# Patient Record
Sex: Male | Born: 1960 | Race: Black or African American | Hispanic: No | Marital: Single | Smoking: Never smoker
Health system: Southern US, Community
[De-identification: ages and names within clinical notes are randomized; demographics above are authoritative.]

## PROBLEM LIST (undated history)

## (undated) DIAGNOSIS — E7800 Pure hypercholesterolemia, unspecified: Secondary | ICD-10-CM

## (undated) DIAGNOSIS — I10 Essential (primary) hypertension: Secondary | ICD-10-CM

## (undated) DIAGNOSIS — L730 Acne keloid: Secondary | ICD-10-CM

## (undated) HISTORY — PX: HERNIA REPAIR: SHX51

## (undated) HISTORY — DX: Acne keloid: L73.0

## (undated) HISTORY — PX: CYST REMOVAL NECK: SHX6281

## (undated) HISTORY — DX: Pure hypercholesterolemia, unspecified: E78.00

---

## 1998-09-24 ENCOUNTER — Emergency Department (HOSPITAL_COMMUNITY): Admission: EM | Admit: 1998-09-24 | Discharge: 1998-09-24 | Payer: Self-pay | Admitting: *Deleted

## 1998-09-24 ENCOUNTER — Encounter: Payer: Self-pay | Admitting: *Deleted

## 1998-12-24 ENCOUNTER — Emergency Department (HOSPITAL_COMMUNITY): Admission: EM | Admit: 1998-12-24 | Discharge: 1998-12-24 | Payer: Self-pay | Admitting: Emergency Medicine

## 1998-12-24 ENCOUNTER — Encounter: Payer: Self-pay | Admitting: Emergency Medicine

## 1999-01-29 ENCOUNTER — Ambulatory Visit (HOSPITAL_COMMUNITY): Admission: RE | Admit: 1999-01-29 | Discharge: 1999-01-29 | Payer: Self-pay | Admitting: Urology

## 2009-03-31 ENCOUNTER — Encounter: Admission: RE | Admit: 2009-03-31 | Discharge: 2009-04-23 | Payer: Self-pay | Admitting: Family Medicine

## 2010-08-27 ENCOUNTER — Encounter (INDEPENDENT_AMBULATORY_CARE_PROVIDER_SITE_OTHER): Payer: Self-pay | Admitting: General Surgery

## 2010-08-31 ENCOUNTER — Encounter (INDEPENDENT_AMBULATORY_CARE_PROVIDER_SITE_OTHER): Payer: Self-pay | Admitting: General Surgery

## 2010-08-31 ENCOUNTER — Ambulatory Visit (INDEPENDENT_AMBULATORY_CARE_PROVIDER_SITE_OTHER): Payer: BC Managed Care – PPO | Admitting: General Surgery

## 2010-08-31 VITALS — BP 150/90 | HR 72 | Temp 98.4°F | Ht 67.0 in | Wt 256.8 lb

## 2010-08-31 DIAGNOSIS — R229 Localized swelling, mass and lump, unspecified: Secondary | ICD-10-CM

## 2010-08-31 DIAGNOSIS — R220 Localized swelling, mass and lump, head: Secondary | ICD-10-CM | POA: Insufficient documentation

## 2010-08-31 NOTE — Patient Instructions (Signed)
Plan to excise scalp mass

## 2010-08-31 NOTE — Progress Notes (Signed)
Subjective:     Patient ID: Brian Kennedy, male   DOB: 1960/02/19, 50 y.o.   MRN: [id number]  HPI We're asked to see the patient in consultation by Dr. Campbell Stall to evaluate him for a mass on the scalp. The patient is a 50 year old white male who has had a lump on the back of his head for a number of years. Over time it has slowly enlarged. He denies any headaches. He denies any drainage from the area. He denies any fevers or chills. No chest pain or shortness of breath. Review of Systems  Constitutional: Negative.   HENT: Negative.   Eyes: Negative.   Respiratory: Negative.   Cardiovascular: Negative.   Gastrointestinal: Negative.   Genitourinary: Negative.   Musculoskeletal: Negative.   Skin: Negative.   Neurological: Negative.   Hematological: Negative.   Psychiatric/Behavioral: Negative.    Past Medical History  Diagnosis Date  . Diabetes mellitus   . Hypercholesterolemia   . Acne keloidalis nuchae    No past surgical history on file.  Current outpatient prescriptions:clindamycin (CLEOCIN T) 1 % external solution, Apply topically daily.  , Disp: , Rfl: ;  Clobetasol Propionate (CLOBETASOL 17 PROPIONATE) 0.5 % POWD, by Does not apply route daily.  , Disp: , Rfl: ;  doxycycline (VIBRAMYCIN) 100 MG capsule, Take 100 mg by mouth 2 (two) times daily.  , Disp: , Rfl:   Not on File      Objective:   Physical Exam  Constitutional: He is oriented to person, place, and time. He appears well-developed and well-nourished.  HENT:  Head: Normocephalic and atraumatic.       Approximately 2 cm mass in the occipital area. It is mobile. No sign of infection.  Eyes: Conjunctivae and EOM are normal. Pupils are equal, round, and reactive to light.  Neck: Normal range of motion. Neck supple.  Cardiovascular: Normal rate, regular rhythm and normal heart sounds.   Pulmonary/Chest: Effort normal and breath sounds normal.  Abdominal: Soft. Bowel sounds are normal.  Musculoskeletal: Normal  range of motion.  Neurological: He is alert and oriented to person, place, and time.  Skin: Skin is warm and dry.  Psychiatric: He has a normal mood and affect. His behavior is normal.       Assessment:     Mass on the occipital area of the scalp.    Plan:     The patient elected to have this removed and since it has been enlarging I think this is a very reasonable thing to do. I discussed with him in detail the risks and benefits they approached and remove this mass as well some technical aspects and understands and wishes to proceed.

## 2010-09-14 DIAGNOSIS — L738 Other specified follicular disorders: Secondary | ICD-10-CM

## 2010-09-16 ENCOUNTER — Encounter (HOSPITAL_COMMUNITY)
Admission: RE | Admit: 2010-09-16 | Discharge: 2010-09-16 | Disposition: A | Discharge status: Home / Self Care | Payer: BC Managed Care – PPO | Source: Ambulatory Visit | Attending: General Surgery | Admitting: General Surgery

## 2010-09-16 LAB — DIFFERENTIAL
Basophils Absolute: 0 10*3/uL (ref 0.0–0.1)
Basophils Relative: 0 % (ref 0–1)
Eosinophils Absolute: 0.1 10*3/uL (ref 0.0–0.7)
Eosinophils Relative: 1 % (ref 0–5)
Lymphocytes Relative: 35 % (ref 12–46)
Lymphs Abs: 2 10*3/uL (ref 0.7–4.0)
Monocytes Absolute: 0.4 10*3/uL (ref 0.1–1.0)
Monocytes Relative: 7 % (ref 3–12)
Neutro Abs: 3.3 10*3/uL (ref 1.7–7.7)
Neutrophils Relative %: 57 % (ref 43–77)

## 2010-09-16 LAB — CBC
HCT: 44.2 % (ref 39.0–52.0)
Hemoglobin: 14.7 g/dL (ref 13.0–17.0)
MCH: 29.2 pg (ref 26.0–34.0)
MCHC: 33.3 g/dL (ref 30.0–36.0)
MCV: 87.7 fL (ref 78.0–100.0)
Platelets: 223 K/uL (ref 150–400)
RBC: 5.04 MIL/uL (ref 4.22–5.81)
RDW: 14 % (ref 11.5–15.5)
WBC: 5.8 K/uL (ref 4.0–10.5)

## 2010-09-16 LAB — BASIC METABOLIC PANEL
BUN: 13 mg/dL (ref 6–23)
Calcium: 10.7 mg/dL — ABNORMAL HIGH (ref 8.4–10.5)
Creatinine, Ser: 1.06 mg/dL (ref 0.50–1.35)
GFR calc Af Amer: >60 mL/min (ref >60)

## 2010-09-16 LAB — SURGICAL PCR SCREEN
MRSA, PCR: NEGATIVE
Staphylococcus aureus: NEGATIVE

## 2010-09-24 ENCOUNTER — Other Ambulatory Visit (INDEPENDENT_AMBULATORY_CARE_PROVIDER_SITE_OTHER): Payer: Self-pay | Admitting: General Surgery

## 2010-09-24 ENCOUNTER — Ambulatory Visit (HOSPITAL_COMMUNITY)
Admission: RE | Admit: 2010-09-24 | Discharge: 2010-09-24 | Disposition: A | Discharge status: Home / Self Care | Payer: BC Managed Care – PPO | Source: Ambulatory Visit | Attending: General Surgery | Admitting: General Surgery

## 2010-09-24 DIAGNOSIS — Z01812 Encounter for preprocedural laboratory examination: Secondary | ICD-10-CM | POA: Insufficient documentation

## 2010-09-24 DIAGNOSIS — Z01810 Encounter for preprocedural cardiovascular examination: Secondary | ICD-10-CM | POA: Insufficient documentation

## 2010-09-24 DIAGNOSIS — L738 Other specified follicular disorders: Secondary | ICD-10-CM | POA: Insufficient documentation

## 2010-09-24 LAB — GLUCOSE, CAPILLARY: Glucose-Capillary: 125 mg/dL — ABNORMAL HIGH (ref 70–99)

## 2010-10-01 NOTE — Op Note (Signed)
  NAMERAVINDER, LUKEHART             ACCOUNT NO.:  [id number]  MEDICAL RECORD NO.:  [id number]  LOCATION:  [facility]                         FACILITY:  [facility]  PHYSICIAN:  Ollen Gross. Vernell Morgans, M.D. DATE OF BIRTH:  May 15, 1960  DATE OF PROCEDURE:  09/24/2010 DATE OF DISCHARGE:                              OPERATIVE REPORT   PREOPERATIVE DIAGNOSIS:  Cyst on the posterior scalp.  POSTOPERATIVE DIAGNOSIS:  Probable lipoma on the posterior scalp.  PROCEDURE:  Excision of mass from the posterior scalp.  SURGEON:  Ollen Gross. Vernell Morgans, MD.  ANESTHESIA:  General endotracheal.  DESCRIPTION OF PROCEDURE:  After informed consent was obtained, the patient was brought to the operating room, left in the supine position on a stretcher.  After induction of general anesthesia, the patient was moved into a prone position on the operating room table and all pressure points were padded.  His posterior scalp was then prepped with Betadine and draped in usual sterile manner.  The area where the palpable mass was, was infiltrated with 0.25% Marcaine with epinephrine.  An elliptical incision was made with a 15 blade knife around the mass. This incision was carried down through the skin and subcutaneous tissue sharply with electrocautery until we got down to the fascia of the scalp.  I did not see anything that looked like a sebaceous cyst.  It was more of a fatty fullness in this area so we excised this fat. Hemostasis was then achieved using Bovie electrocautery.  We then close the wound with interrupted 3-0 nylon vertical mattress stitches and antibiotic ointment and sterile dressings were applied.  The patient tolerated the procedure well.  At the end of the case, all needle, sponge, and instrument counts were correct.  The patient was then awake and taken to recovery room in stable condition.     Ollen Gross. Vernell Morgans, M.D.     PST/MEDQ  D:  09/24/2010  T:  09/24/2010  Job:  [id number]  Electronically Signed  by Chevis Pretty III M.D. on 10/01/2010 02:02:54 PM

## 2010-10-04 ENCOUNTER — Emergency Department (HOSPITAL_COMMUNITY)
Admission: EM | Admit: 2010-10-04 | Discharge: 2010-10-04 | Disposition: A | Discharge status: Home / Self Care | Payer: BC Managed Care – PPO | Attending: Emergency Medicine | Admitting: Emergency Medicine

## 2010-10-04 ENCOUNTER — Ambulatory Visit (INDEPENDENT_AMBULATORY_CARE_PROVIDER_SITE_OTHER): Payer: BC Managed Care – PPO | Admitting: General Surgery

## 2010-10-04 ENCOUNTER — Telehealth (INDEPENDENT_AMBULATORY_CARE_PROVIDER_SITE_OTHER): Payer: Self-pay | Admitting: General Surgery

## 2010-10-04 DIAGNOSIS — T8130XA Disruption of wound, unspecified, initial encounter: Secondary | ICD-10-CM | POA: Insufficient documentation

## 2010-10-04 DIAGNOSIS — E119 Type 2 diabetes mellitus without complications: Secondary | ICD-10-CM | POA: Insufficient documentation

## 2010-10-04 DIAGNOSIS — Z79899 Other long term (current) drug therapy: Secondary | ICD-10-CM | POA: Insufficient documentation

## 2010-10-04 DIAGNOSIS — E785 Hyperlipidemia, unspecified: Secondary | ICD-10-CM | POA: Insufficient documentation

## 2010-10-04 DIAGNOSIS — Y838 Other surgical procedures as the cause of abnormal reaction of the patient, or of later complication, without mention of misadventure at the time of the procedure: Secondary | ICD-10-CM | POA: Insufficient documentation

## 2010-10-04 DIAGNOSIS — Z09 Encounter for follow-up examination after completed treatment for conditions other than malignant neoplasm: Secondary | ICD-10-CM

## 2010-10-04 NOTE — Telephone Encounter (Signed)
I CALLED MR Brian Kennedy TO SCHEDULE A NURSE ONLY VISIT ON 10-05-10. FOR WOUND REPACKING AFTER HE WAS SEEN IN ER THIS PM.

## 2010-10-04 NOTE — Telephone Encounter (Signed)
Ann @ [facility] called to make [place] aware home health will not come out because patient isn't home bound. Please call patient with appt to see [place].

## 2010-10-04 NOTE — Progress Notes (Signed)
PT HERE FOR SUTURE REMOVAL PER DISCHARGE INSTRUCTIONS. INCISION CLOSED WITHOUT DRAINAGE. SUTURES REMOVED WITHOUT DIFFICULTY AND BAND-AID APPLIED/ EDGES SLIGHTY PUFFY WITHOUT FLUCTUANCE. F/U APPT MADE WITH DR. Demetrius Charity. TOTH.

## 2010-10-05 ENCOUNTER — Encounter (INDEPENDENT_AMBULATORY_CARE_PROVIDER_SITE_OTHER): Payer: Self-pay | Admitting: General Surgery

## 2010-10-05 ENCOUNTER — Ambulatory Visit (INDEPENDENT_AMBULATORY_CARE_PROVIDER_SITE_OTHER): Payer: BC Managed Care – PPO | Admitting: General Surgery

## 2010-10-05 DIAGNOSIS — Z5189 Encounter for other specified aftercare: Secondary | ICD-10-CM

## 2010-10-05 NOTE — Progress Notes (Signed)
PT HERE TO HAVE NECK WOUND REPACKED. 2X2 PACKING REMOVED WITHOUT DIFFICULTY AND CAVITY GENTLY CLEANED WITH N/S DAMPENED STERILE Q-TIP. CAVITY PINK WITHOUT EXUDATE. WOUND REPACKED WITH STERILE WET-DRY 2X2 GAUZE AND STERILE 4X4 DRESSING APPLIED. PT TO RETURN IN AM FOR RCK.

## 2010-10-05 NOTE — Progress Notes (Signed)
PT SEEN FOR WOUND CK AS NURSE ONLY

## 2010-10-06 ENCOUNTER — Encounter (INDEPENDENT_AMBULATORY_CARE_PROVIDER_SITE_OTHER): Payer: BC Managed Care – PPO

## 2010-10-07 ENCOUNTER — Ambulatory Visit (INDEPENDENT_AMBULATORY_CARE_PROVIDER_SITE_OTHER): Payer: BC Managed Care – PPO | Admitting: General Surgery

## 2010-10-07 ENCOUNTER — Encounter (INDEPENDENT_AMBULATORY_CARE_PROVIDER_SITE_OTHER): Payer: BC Managed Care – PPO | Admitting: General Surgery

## 2010-10-07 DIAGNOSIS — L723 Sebaceous cyst: Secondary | ICD-10-CM

## 2010-10-07 NOTE — Progress Notes (Signed)
PT HERE FOR WOUND CK OF NECK. PT REMOVED PACKING THIS AM AND SHOWERED AS INSTRUCTED AND PLACED GAUZE DRESSING OVER OPENING. WOUND APPEARS CLEAN WITH EXUDATE. DR. Carolynne Edouard ALSO LOOKED AT WOUND. PT INSTRUCTED HOW TO PACK WOUND WITH [id number] GAUZE DAILY AFTER SHOWER. HE WILL SEE NURSE NEXT WEEK FOR RCK.

## 2010-10-08 ENCOUNTER — Ambulatory Visit (INDEPENDENT_AMBULATORY_CARE_PROVIDER_SITE_OTHER): Payer: BC Managed Care – PPO

## 2010-10-08 DIAGNOSIS — IMO0001 Reserved for inherently not codable concepts without codable children: Secondary | ICD-10-CM

## 2010-10-08 NOTE — Progress Notes (Signed)
Temp 97.2, dressing to patients occipital area removed - packed with 2.2 moist saline gauze- wound pink/ clean- no signs of infection.  Patient tolerated procedure very well.  He will return on Monday for follow up wound care in the office. 10/08/2010

## 2010-10-11 ENCOUNTER — Ambulatory Visit (INDEPENDENT_AMBULATORY_CARE_PROVIDER_SITE_OTHER): Payer: BC Managed Care – PPO

## 2010-10-11 DIAGNOSIS — IMO0001 Reserved for inherently not codable concepts without codable children: Secondary | ICD-10-CM

## 2010-10-11 NOTE — Progress Notes (Signed)
[name] arrived this morning for occipital wound recheck and packing.  When the outer dressing was removed from the wound area it was found not to be packed. The surgical site was covered with a gauze pad. Therefore there was some red drainage on the dressing and a bit in the surgical cavity. The cavity was was cleansed with saline and repacked.  The cavity was still red and moist. An area in the 1 o'clock position had a speck of blackness.  Patient was given proper packing instructions and wound care. He will have the area repacked this evening and follow up with Germaine at 2 pm tomorrow. Patient stated his sister redressed the are over the weekend.

## 2010-10-12 ENCOUNTER — Ambulatory Visit (INDEPENDENT_AMBULATORY_CARE_PROVIDER_SITE_OTHER): Payer: BC Managed Care – PPO

## 2010-10-12 DIAGNOSIS — R220 Localized swelling, mass and lump, head: Secondary | ICD-10-CM

## 2010-10-12 NOTE — Progress Notes (Signed)
PT HERE FOR WOUND CHECK AND DRESSING CHANGE. NECK WOUND. S/P OPEN INCISION/ WOUND CLEAN  AND LESS SHALLOW, WOUND BED PINK WITH MINIMAL DRAINAGE. REPACKED WITH STERILE 2X2 AND 4X4 GAUZE.

## 2010-10-12 NOTE — Progress Notes (Deleted)
Pt here today for suture removal.  Cyst was removed from the upper back on 09/28/10 by Dr. Abbey Chatters.  The wound has healed and the sutures were removed and steri strips placed.  Pt was instructed to keep the wound covered under his clothes and to call back if he had any questions or concerns.  We will see him prn.

## 2010-10-13 ENCOUNTER — Encounter (INDEPENDENT_AMBULATORY_CARE_PROVIDER_SITE_OTHER): Payer: BC Managed Care – PPO

## 2010-10-15 ENCOUNTER — Ambulatory Visit (INDEPENDENT_AMBULATORY_CARE_PROVIDER_SITE_OTHER): Payer: BC Managed Care – PPO | Admitting: General Surgery

## 2010-10-15 DIAGNOSIS — L723 Sebaceous cyst: Secondary | ICD-10-CM

## 2010-10-15 NOTE — Progress Notes (Deleted)
PT HERE FOR WOUND CHECK AND REPACK. WOUND BED OF NECK APPEARS PINK AND HEALTHY LOOKING. MINIMAL PINK TINGED DRAINAGE. WOUND CLEANED WITH STERILE N/S AND REPACKED WITH STERILE  2X2 GAUZE. DRESSING APPLIED.

## 2010-10-18 ENCOUNTER — Ambulatory Visit (INDEPENDENT_AMBULATORY_CARE_PROVIDER_SITE_OTHER): Payer: BC Managed Care – PPO | Admitting: General Surgery

## 2010-10-18 DIAGNOSIS — L723 Sebaceous cyst: Secondary | ICD-10-CM

## 2010-10-18 NOTE — Progress Notes (Signed)
NECK WOUND CLEAN AND PINK. CLOSING IN. SMALL AMT PINK TINGED DRAINAGE NOTED. REPACKED WITH STERILE 2X2 GAUZE AND 4X4 OUTER DRESSING.

## 2010-10-18 NOTE — Progress Notes (Signed)
PT HERE FOR WOUND CK/ NECK WOUND AFTER EXCISION NECK MASS. WOUND BED CLEAN AND PINK. SMALL AMT PINK TINGED DRAINAGE NOTED ON DRESSING. WOUND REPACKED WITH STERILE 2X2 GAUZE AND STERILE DRESSING.

## 2010-10-19 ENCOUNTER — Ambulatory Visit (INDEPENDENT_AMBULATORY_CARE_PROVIDER_SITE_OTHER): Payer: BC Managed Care – PPO | Admitting: General Surgery

## 2010-10-19 DIAGNOSIS — L723 Sebaceous cyst: Secondary | ICD-10-CM

## 2010-10-19 NOTE — Progress Notes (Signed)
PT HERE FOR PACKING CHANGE OF WOUND OF NECK. WOUND BED PINK AND SMALL AMT PINK TINGED DRAINAGE ON DRESSING. WOUND CLEANED WITH STERILE NORMAL SALINE AND REPACKED WITH STERILE 2X2 GAUZE.

## 2010-10-20 ENCOUNTER — Encounter (INDEPENDENT_AMBULATORY_CARE_PROVIDER_SITE_OTHER): Payer: BC Managed Care – PPO | Admitting: General Surgery

## 2010-10-21 ENCOUNTER — Ambulatory Visit (INDEPENDENT_AMBULATORY_CARE_PROVIDER_SITE_OTHER): Payer: BC Managed Care – PPO | Admitting: General Surgery

## 2010-10-21 DIAGNOSIS — L723 Sebaceous cyst: Secondary | ICD-10-CM

## 2010-10-21 NOTE — Progress Notes (Signed)
NECK WOUND SMALLER IN APPEARANCE. CLEANED WITH NORMAL SALINE. REPACKED WITH STERILE 2X2. 4X4 DRESSING APPLIED AND TAPED.

## 2010-10-25 ENCOUNTER — Ambulatory Visit (INDEPENDENT_AMBULATORY_CARE_PROVIDER_SITE_OTHER): Payer: BC Managed Care – PPO | Admitting: General Surgery

## 2010-10-25 DIAGNOSIS — L723 Sebaceous cyst: Secondary | ICD-10-CM

## 2010-10-25 NOTE — Progress Notes (Signed)
NECK WOUND CLOSING IN SINCE LAST VISIT.WOUND BED PINK AND SMALLER. NO DRAINAGE. WOUND CLEANED WITH STEERILE SALINE AND STERILE 2X2 GAUZE APPLIED WITH 4X4 DRESSING.

## 2010-10-26 ENCOUNTER — Encounter (INDEPENDENT_AMBULATORY_CARE_PROVIDER_SITE_OTHER): Payer: Self-pay | Admitting: General Surgery

## 2010-10-26 ENCOUNTER — Ambulatory Visit (INDEPENDENT_AMBULATORY_CARE_PROVIDER_SITE_OTHER): Payer: BC Managed Care – PPO | Admitting: General Surgery

## 2010-10-26 DIAGNOSIS — R220 Localized swelling, mass and lump, head: Secondary | ICD-10-CM

## 2010-10-26 DIAGNOSIS — R229 Localized swelling, mass and lump, unspecified: Secondary | ICD-10-CM

## 2010-10-26 DIAGNOSIS — S0100XA Unspecified open wound of scalp, initial encounter: Secondary | ICD-10-CM

## 2010-10-26 NOTE — Progress Notes (Signed)
Subjective:     Patient ID: Brian Kennedy, male   DOB: October 20, 1960, 50 y.o.   MRN: [id number]  HPI The patient is a 50 year old white male who is a couple weeks out from an excision of the mass from his posterior scalp. His postoperative course was complicated by dehiscence of the wound the stitches were removed. He has been getting dressing changes to this area and tolerating this well.  Review of Systems     Objective:   Physical Exam On exam the wound is definitely getting more shallow. It is very clean with good granulation tissue. We redressed this today and he tolerated this well.    Assessment:     Postop from excision of a scalp mass with some dehiscence of the incision.    Plan:     He has been doing a very good job taking care of the wound. At this point he will continue to shower daily and change the dressing daily. We will see him back in about 3 weeks to check his progress.

## 2010-10-26 NOTE — Patient Instructions (Signed)
Continue daily shower and dressing change 

## 2010-11-19 ENCOUNTER — Encounter (INDEPENDENT_AMBULATORY_CARE_PROVIDER_SITE_OTHER): Payer: BC Managed Care – PPO | Admitting: General Surgery

## 2010-12-07 ENCOUNTER — Encounter (INDEPENDENT_AMBULATORY_CARE_PROVIDER_SITE_OTHER): Payer: Self-pay | Admitting: General Surgery

## 2010-12-07 ENCOUNTER — Ambulatory Visit (INDEPENDENT_AMBULATORY_CARE_PROVIDER_SITE_OTHER): Payer: BC Managed Care – PPO | Admitting: General Surgery

## 2010-12-07 VITALS — BP 148/90 | HR 68 | Temp 97.1°F | Resp 16 | Ht 67.0 in | Wt 256.0 lb

## 2010-12-07 DIAGNOSIS — S0100XA Unspecified open wound of scalp, initial encounter: Secondary | ICD-10-CM

## 2010-12-17 NOTE — Progress Notes (Signed)
Subjective:     Patient ID: Brian Kennedy, male   DOB: 20-Jul-1960, 50 y.o.   MRN: [id number]  HPI The patient is a 50 year old black male who is about 2-1/2 months out from excision of some inflammatory tissue from his posterior scalp. His postoperative course was complicated by dehiscence of the incision. He has been doing dressing changes to the area since. He feels as though it is healed up now. He has no complaints.  Review of Systems     Objective:   Physical Exam On exam the wound on his posterior scalp it is completely healed now. There is no sign of infection.    Assessment:     2 anastomosis status post excision of a mass from the posterior scalp    Plan:     At this point I believe he can return to his normal activities without any restrictions. We will plan to see him back on a p.r.n. basis.

## 2011-12-30 ENCOUNTER — Emergency Department (HOSPITAL_COMMUNITY): Payer: BC Managed Care – PPO

## 2011-12-30 ENCOUNTER — Encounter (HOSPITAL_COMMUNITY): Payer: Self-pay | Admitting: Emergency Medicine

## 2011-12-30 ENCOUNTER — Emergency Department (HOSPITAL_COMMUNITY)
Admission: EM | Admit: 2011-12-30 | Discharge: 2011-12-30 | Disposition: A | Discharge status: Home / Self Care | Payer: BC Managed Care – PPO | Attending: Emergency Medicine | Admitting: Emergency Medicine

## 2011-12-30 DIAGNOSIS — Z79899 Other long term (current) drug therapy: Secondary | ICD-10-CM | POA: Insufficient documentation

## 2011-12-30 DIAGNOSIS — Z872 Personal history of diseases of the skin and subcutaneous tissue: Secondary | ICD-10-CM | POA: Insufficient documentation

## 2011-12-30 DIAGNOSIS — M549 Dorsalgia, unspecified: Secondary | ICD-10-CM

## 2011-12-30 DIAGNOSIS — E7800 Pure hypercholesterolemia, unspecified: Secondary | ICD-10-CM | POA: Insufficient documentation

## 2011-12-30 DIAGNOSIS — Z7982 Long term (current) use of aspirin: Secondary | ICD-10-CM | POA: Insufficient documentation

## 2011-12-30 DIAGNOSIS — M545 Low back pain, unspecified: Secondary | ICD-10-CM | POA: Insufficient documentation

## 2011-12-30 DIAGNOSIS — R55 Syncope and collapse: Secondary | ICD-10-CM | POA: Insufficient documentation

## 2011-12-30 DIAGNOSIS — E119 Type 2 diabetes mellitus without complications: Secondary | ICD-10-CM | POA: Insufficient documentation

## 2011-12-30 LAB — CBC WITH DIFFERENTIAL/PLATELET
Basophils Absolute: 0 10*3/uL (ref 0.0–0.1)
Eosinophils Relative: 2 % (ref 0–5)
HCT: 40.3 % (ref 39.0–52.0)
Hemoglobin: 13.3 g/dL (ref 13.0–17.0)
Lymphocytes Relative: 26 % (ref 12–46)
MCHC: 33 g/dL (ref 30.0–36.0)
MCV: 87.2 fL (ref 78.0–100.0)
Monocytes Absolute: 0.4 10*3/uL (ref 0.1–1.0)
Monocytes Relative: 7 % (ref 3–12)
Neutro Abs: 3.8 10*3/uL (ref 1.7–7.7)
RDW: 13.9 % (ref 11.5–15.5)
WBC: 5.8 10*3/uL (ref 4.0–10.5)

## 2011-12-30 LAB — GLUCOSE, CAPILLARY

## 2011-12-30 LAB — BASIC METABOLIC PANEL
BUN: 17 mg/dL (ref 6–23)
CO2: 22 mEq/L (ref 19–32)
Calcium: 9.5 mg/dL (ref 8.4–10.5)
Chloride: 105 mEq/L (ref 96–112)
Creatinine, Ser: 1.07 mg/dL (ref 0.50–1.35)

## 2011-12-30 MED ORDER — SODIUM CHLORIDE 0.9 % IV BOLUS (SEPSIS)
1000.0000 mL | Freq: Once | INTRAVENOUS | Status: AC
  Administered 2011-12-30: 1000 mL via INTRAVENOUS

## 2011-12-30 MED ORDER — DIAZEPAM 2 MG PO TABS
2.0000 mg | ORAL_TABLET | Freq: Once | ORAL | Status: AC
  Administered 2011-12-30: 2 mg via ORAL
  Filled 2011-12-30: qty 1

## 2011-12-30 MED ORDER — OXYCODONE-ACETAMINOPHEN 5-325 MG PO TABS: 1.0000 | ORAL_TABLET | Freq: Four times a day (QID) | ORAL | Status: DC | PRN

## 2011-12-30 MED ORDER — SODIUM CHLORIDE 0.9 % IV BOLUS (SEPSIS): 1000.0000 mL | Freq: Once | INTRAVENOUS | Status: DC

## 2011-12-30 MED ORDER — CYCLOBENZAPRINE HCL 10 MG PO TABS: 10.0000 mg | ORAL_TABLET | Freq: Two times a day (BID) | ORAL | Status: DC | PRN

## 2011-12-30 MED ORDER — ONDANSETRON 4 MG PO TBDP
4.0000 mg | ORAL_TABLET | Freq: Once | ORAL | Status: AC
  Administered 2011-12-30: 4 mg via ORAL
  Filled 2011-12-30: qty 1

## 2011-12-30 MED ORDER — HYDROMORPHONE HCL PF 1 MG/ML IJ SOLN
1.0000 mg | Freq: Once | INTRAMUSCULAR | Status: AC
  Administered 2011-12-30: 1 mg via INTRAMUSCULAR
  Filled 2011-12-30: qty 1

## 2011-12-30 NOTE — ED Provider Notes (Signed)
History     CSN: [id number]  Arrival date & time 12/30/11  [id number]   First MD Initiated Contact with Patient 12/30/11 0930      Chief Complaint  Patient presents with  . Back Pain    low back pain x 3 days. Denies injury    (Consider location/radiation/quality/duration/timing/severity/associated sxs/prior treatment) HPI  Pt to the ER with complaints of low back pain. He says that it has been hurting since Tuesday when  he saw his PCP but didn't think to mention it to him at that time. He felt that it has been getting worse . Now his back hurts so bad that he does not want to sit down. It is more comfortable for him to be up  and walking. He denies numbness, tingling, weakness in his legs. The back hurts mainly LL back.  He  has not had any loss of bowel or urine control. He tells me that over all he has been feeling well but  his sugars were a little low at home this morning at 88 but after he ate a biscuit they have come back  to normal. nad vss  Past Medical History  Diagnosis Date  . Diabetes mellitus   . Hypercholesterolemia   . Acne keloidalis nuchae     Past Surgical History  Procedure Date  . Hernia repair     Family History  Problem Relation Age of Onset  . Diabetes Mother     History  Substance Use Topics  . Smoking status: Never Smoker   . Smokeless tobacco: Never Used  . Alcohol Use: No      Review of Systems  Review of Systems  Gen: no weight loss, fevers, chills, night sweats  Neck: no neck pain  Lungs:No wheezing, coughing or hemoptysis CV: no chest pain, palpitations, dependent edema or orthopnea  Abd: no abdominal pain, nausea, vomiting  GU: no dysuria or gross hematuria  MSK:  Low back pain Neuro: no headache, no focal neurologic deficits  Skin: no abnormalities Psyche: negative.   Allergies  Review of patient's allergies indicates no known allergies.  Home Medications   Current Outpatient Rx  Name  Route  Sig  Dispense  Refill    . ASPIRIN 325 MG PO TABS   Oral   Take 325 mg by mouth daily.         Marland Kitchen CLINDAMYCIN PHOSPHATE 1 % EX SOLN   Topical   Apply topically daily.           Marland Kitchen DOXYCYCLINE HYCLATE 100 MG PO CAPS   Oral   Take 100 mg by mouth 2 (two) times daily.           Marland Kitchen GLIPIZIDE ER 5 MG PO TB24   Oral   Take 5 mg by mouth daily.         Letta Pate ULTRA BLUE VI STRP   Other   1 each by Other route Daily.          Marland Kitchen PIOGLITAZONE HCL-METFORMIN HCL 15-500 MG PO TABS   Oral   Take 1 tablet by mouth 2 (two) times daily with a meal.         . SIMVASTATIN 40 MG PO TABS   Oral   Take 40 mg by mouth every evening.           BP 116/61  Pulse 58  Temp 98.2 F (36.8 C) (Oral)  Resp 18  Wt 255 lb (115.667 kg)  SpO2  95%  Physical Exam  Nursing note and vitals reviewed. Constitutional: He appears well-developed and well-nourished. No distress.  HENT:  Head: Normocephalic and atraumatic.  Eyes: Pupils are equal, round, and reactive to light.  Neck: Normal range of motion. Neck supple.  Cardiovascular: Normal rate and regular rhythm.   Pulmonary/Chest: Effort normal.  Abdominal: Soft.  Musculoskeletal:       Lumbar back: He exhibits decreased range of motion, tenderness, pain and spasm. He exhibits no bony tenderness, no swelling, no edema, no deformity, no laceration and normal pulse.       Back:        Equal strength to bilateral lower extremities. Neurosensory function adequate to both legs. Skin color is normal. Skin is warm and moist. I see no step off deformity, no bony tenderness. Pt is able to ambulate without limp. Pain is relieved when sitting in certain positions. ROM is decreased due to pain. No crepitus, laceration, effusion, swelling.  Pulses are normal   Neurological: He is alert.  Skin: Skin is warm and dry.    ED Course  Procedures (including critical care time)  Labs Reviewed  BASIC METABOLIC PANEL - Abnormal; Notable for the following:    Glucose, Bld 142  (*)     GFR calc non Af Amer 78 (*)     All other components within normal limits  GLUCOSE, CAPILLARY - Abnormal; Notable for the following:    Glucose-Capillary 117 (*)     All other components within normal limits  GLUCOSE, CAPILLARY - Abnormal; Notable for the following:    Glucose-Capillary 134 (*)     All other components within normal limits  CBC WITH DIFFERENTIAL  TROPONIN I   Dg Lumbar Spine Complete  12/30/2011  *RADIOLOGY REPORT*  Clinical Data: Low back pain  LUMBAR SPINE - COMPLETE 4+ VIEW  Comparison: None.  Findings: Patchy aortic calcifications.  No suggestion of aneurysm. There is no evidence of lumbar spine fracture.  Alignment is normal.  Intervertebral disc spaces are maintained.  IMPRESSION: Negative.   Original Report Authenticated By: D. Andria Rhein, MD      1. Diabetes   2. Back pain   3. Vagal reaction       MDM   Date: 12/30/2011  Rate: 58  Rhythm: normal sinus rhythm  QRS Axis: normal  Intervals: normal  ST/T Wave abnormalities: normal  Conduction Disutrbances:none  Narrative Interpretation:   Old EKG Reviewed: none available    I have ordered 1mg  IM Dilaudid for pt and 2mg  PO Valium  Within 5 minutes of receiving the medication the patient told the RN, Anmarie that he felt his sugar dropping again and was feeling very sick. She checked his BP and he had become cold and clammy. His BP dropped to 88/45.  Anmarie assisted pt to stretch, placed in reclining position. She gave him 8oz of regular (non diet sprite) and 5 minutes, before fluids started or any other intervention had been initiated, the patients CBG was noted to be 115. The patient was in significant pain and then the shot hurt really bad as well.  He appears to have vagal reaction with rapid recovery.  All labs normal. Troponin and EKG normal  Pt was monitored for 2 more hours in the ED. His BP reexamined between 105/57 and 116/61. He admits his back is still a bit uncomfortable but he  feels much better. He has eaten a sandwich since then. He ambulated in room without any adverse events.  Pt  tells me that sometimes his sugar gets real low when he is in pain.   CRITICAL CARE Performed by: Dorthula Matas   Total critical care time: 45  Critical care time was exclusive of separately billable procedures and treating other patients.  Critical care was necessary to treat or prevent imminent or life-threatening deterioration.  Critical care was time spent personally by me on the following activities: development of treatment plan with patient and/or surrogate as well as nursing, discussions with consultants, evaluation of patient's response to treatment, examination of patient, obtaining history from patient or surrogate, ordering and performing treatments and interventions, ordering and review of laboratory studies, ordering and review of radiographic studies, pulse oximetry and re-evaluation of patient's condition.  Dr. Effie Shy was notified immediatly and evaluated the patient as well.  Patient with back pain. No neurological deficits. Patient is ambulatory. No warning symptoms of back pain including: loss of bowel or bladder control, night sweats, waking from sleep with back pain, unexplained fevers or weight loss, h/o cancer, IVDU, recent trauma. No concern for cauda equina, epidural abscess, or other serious cause of back pain. Conservative measures such as rest, ice/heat and pain medicine indicated with PCP follow-up if no improvement with conservative management.          Dorthula Matas, PA 12/30/11 1323

## 2011-12-30 NOTE — ED Notes (Signed)
Pt c/o feeling light headed, was sweating. BP decreased to 80/48 hr 50. Pt remained alert. PA at bedside. Had  been given 4 oz carbonated soda-sweetened over the last 5 minutes.

## 2011-12-30 NOTE — ED Notes (Signed)
Manual BP 96/55. HR 59. CBG 117. Report given to acute care RN. Family remains at bedside

## 2011-12-30 NOTE — ED Notes (Signed)
Pt assisted to stretcher chair and placed in reclining position. Drank 4 more oz. of Sprite. Pt was able to stand and take steps too chair with moderate assist. EDP in room. BP increased 88/48 HR 55

## 2011-12-30 NOTE — ED Provider Notes (Signed)
Medical screening examination/treatment/procedure(s) were conducted as a shared visit with non-physician practitioner(s) and myself.  I personally evaluated the patient during the encounter  Flint Melter, MD 12/30/11 [phone]

## 2011-12-30 NOTE — ED Notes (Addendum)
Pt reports increased low back pain over last 3 days. Currently having severe pain and difficulty walking. Denies numbness or tingling in lower extremities. PCP unable to see pt until this afternoon. Pt reported CBG of 88 this am. Ate small breakfast

## 2011-12-30 NOTE — ED Notes (Signed)
Ambulated pt in room - in moderate amount of pain, pt states pain has improved and is bearable now

## 2011-12-30 NOTE — ED Provider Notes (Signed)
Brian Kennedy is a 51 y.o. male who is being evaluated for back pain. He received IM medications and shortly thereafter, had a drop in his blood pressure. He improved quickly with oral fluids and resting supine his pain is improved  Blood pressure stabilized, and became normal.   Medical screening examination/treatment/procedure(s) were conducted as a shared visit with non-physician practitioner(s) and myself.  I personally evaluated the patient during the encounter  Flint Melter, MD 12/30/11 [phone]

## 2012-10-17 ENCOUNTER — Other Ambulatory Visit: Payer: Self-pay | Admitting: Family Medicine

## 2012-10-17 ENCOUNTER — Ambulatory Visit
Admission: RE | Admit: 2012-10-17 | Discharge: 2012-10-17 | Disposition: A | Discharge status: Home / Self Care | Payer: BC Managed Care – PPO | Source: Ambulatory Visit | Attending: Family Medicine | Admitting: Family Medicine

## 2012-10-17 DIAGNOSIS — M545 Low back pain: Secondary | ICD-10-CM

## 2012-10-17 DIAGNOSIS — M25532 Pain in left wrist: Secondary | ICD-10-CM

## 2013-02-23 ENCOUNTER — Emergency Department (HOSPITAL_BASED_OUTPATIENT_CLINIC_OR_DEPARTMENT_OTHER)
Admission: EM | Admit: 2013-02-23 | Discharge: 2013-02-24 | Disposition: A | Discharge status: Home / Self Care | Payer: Worker's Compensation | Attending: Emergency Medicine | Admitting: Emergency Medicine

## 2013-02-23 ENCOUNTER — Encounter (HOSPITAL_BASED_OUTPATIENT_CLINIC_OR_DEPARTMENT_OTHER): Payer: Self-pay | Admitting: Emergency Medicine

## 2013-02-23 DIAGNOSIS — Z79899 Other long term (current) drug therapy: Secondary | ICD-10-CM | POA: Diagnosis not present

## 2013-02-23 DIAGNOSIS — S9030XA Contusion of unspecified foot, initial encounter: Secondary | ICD-10-CM | POA: Diagnosis not present

## 2013-02-23 DIAGNOSIS — Y929 Unspecified place or not applicable: Secondary | ICD-10-CM | POA: Diagnosis not present

## 2013-02-23 DIAGNOSIS — Z7982 Long term (current) use of aspirin: Secondary | ICD-10-CM | POA: Diagnosis not present

## 2013-02-23 DIAGNOSIS — IMO0002 Reserved for concepts with insufficient information to code with codable children: Secondary | ICD-10-CM | POA: Diagnosis not present

## 2013-02-23 DIAGNOSIS — Y9389 Activity, other specified: Secondary | ICD-10-CM | POA: Diagnosis not present

## 2013-02-23 DIAGNOSIS — S99919A Unspecified injury of unspecified ankle, initial encounter: Secondary | ICD-10-CM | POA: Diagnosis present

## 2013-02-23 DIAGNOSIS — T148XXA Other injury of unspecified body region, initial encounter: Secondary | ICD-10-CM

## 2013-02-23 DIAGNOSIS — E119 Type 2 diabetes mellitus without complications: Secondary | ICD-10-CM | POA: Diagnosis not present

## 2013-02-23 DIAGNOSIS — E7800 Pure hypercholesterolemia, unspecified: Secondary | ICD-10-CM | POA: Insufficient documentation

## 2013-02-23 DIAGNOSIS — Z872 Personal history of diseases of the skin and subcutaneous tissue: Secondary | ICD-10-CM | POA: Insufficient documentation

## 2013-02-23 DIAGNOSIS — S8990XA Unspecified injury of unspecified lower leg, initial encounter: Secondary | ICD-10-CM | POA: Diagnosis present

## 2013-02-23 NOTE — ED Notes (Signed)
Was pulling a piece of equipment behind him tonight.  He stopped and the equipment didn't, running into the back of his left foot.  Sustained bruise/abrasion.

## 2013-02-23 NOTE — ED Provider Notes (Signed)
CSN: [id number]     Arrival date & time 02/23/13  2140 History  This chart was scribed for Tobin Witucki Alfonso Patten, MD by Rolanda Lundborg, ED Scribe. This patient was seen in room MH06/MH06 and the patient's care was started at 11:50 PM.    Chief Complaint  Patient presents with  . Foot Injury   Patient is a 52 y.o. male presenting with foot injury. The history is provided by the patient. No language interpreter was used.  Foot Injury Location:  Foot Injury: yes   Foot location:  L foot Pain details:    Quality:  Aching   Radiates to:  Does not radiate   Severity:  Moderate   Onset quality:  Sudden   Timing:  Constant   Progression:  Unchanged Chronicity:  New Dislocation: no   Foreign body present:  No foreign bodies Relieved by:  Nothing Worsened by:  Nothing tried Ineffective treatments:  None tried Associated symptoms: no back pain   Risk factors: no concern for non-accidental trauma    HPI Comments: Brian Kennedy is a 53 y.o. male with a h/o DM who presents to the Emergency Department complaining of constant, unchanged left foot pain since having a 200 pound welding maching run over his foot while pulling it tonight around 9PM tonight. He reports bruising and abrasion to the area. He has been icing the area. He did not take any OTC medications.  Past Medical History  Diagnosis Date  . Diabetes mellitus   . Hypercholesterolemia   . Acne keloidalis nuchae    Past Surgical History  Procedure Laterality Date  . Hernia repair     Family History  Problem Relation Age of Onset  . Diabetes Mother    History  Substance Use Topics  . Smoking status: Never Smoker   . Smokeless tobacco: Never Used  . Alcohol Use: No    Review of Systems  Musculoskeletal: Negative for back pain.  All other systems reviewed and are negative.      Allergies  Review of patient's allergies indicates no known allergies.  Home Medications   Current Outpatient Rx  Name  Route  Sig   Dispense  Refill  . aspirin 325 MG tablet   Oral   Take 325 mg by mouth daily.         . clindamycin (CLEOCIN T) 1 % external solution   Topical   Apply topically daily.           . cyclobenzaprine (FLEXERIL) 10 MG tablet   Oral   Take 1 tablet (10 mg total) by mouth 2 (two) times daily as needed for muscle spasms.   20 tablet   0   . doxycycline (VIBRAMYCIN) 100 MG capsule   Oral   Take 100 mg by mouth 2 (two) times daily.           Marland Kitchen glipiZIDE (GLUCOTROL XL) 5 MG 24 hr tablet   Oral   Take 5 mg by mouth daily.         . ONE TOUCH ULTRA TEST test strip   Other   1 each by Other route Daily.          Marland Kitchen oxyCODONE-acetaminophen (PERCOCET/ROXICET) 5-325 MG per tablet   Oral   Take 1 tablet by mouth every 6 (six) hours as needed for pain.   15 tablet   0   . pioglitazone-metformin (ACTOPLUS MET) 15-500 MG per tablet   Oral   Take 1 tablet by  mouth 2 (two) times daily with a meal.         . simvastatin (ZOCOR) 40 MG tablet   Oral   Take 40 mg by mouth every evening.          BP 165/80  Pulse 72  Temp(Src) 97.8 F (36.6 C) (Oral)  Resp 20  Ht _0  (1.702 m)  Wt 238 lb (107.956 kg)  BMI 37.27 kg/m2  SpO2 100% Physical Exam  Nursing note and vitals reviewed. Constitutional: He is oriented to person, place, and time. He appears well-developed and well-nourished. No distress.  HENT:  Head: Normocephalic and atraumatic.  Mouth/Throat: Oropharynx is clear and moist.  Eyes: EOM are normal. Pupils are equal, round, and reactive to light.  Neck: Neck supple. No tracheal deviation present.  Cardiovascular: Normal rate and regular rhythm.   Pulmonary/Chest: Effort normal and breath sounds normal. No respiratory distress. He has no wheezes.  Abdominal: Soft. Bowel sounds are normal. There is no tenderness. There is no rebound and no guarding.  Musculoskeletal: Normal range of motion. He exhibits no edema.  Dorsalis pedis pulses intact 2+. Cap refill less  than 2 sec to all digits of the left foot. Feet left and right equal warmth, B achilles tendons intact. Symmetric. No swelling overtly. Plantar fascia intact. NVI sensation intact to all nerve distributions.  Neurological: He is alert and oriented to person, place, and time.  Skin: Skin is warm and dry.  Psychiatric: He has a normal mood and affect. His behavior is normal.    ED Course  Procedures (including critical care time) Medications - No data to display  DIAGNOSTIC STUDIES: Oxygen Saturation is 100% on RA, normal by my interpretation.    COORDINATION OF CARE: 11:56 PM- Discussed treatment plan with pt which includes x-ray. Pt agrees to plan.    Labs Review Labs Reviewed - No data to display Imaging Review No results found.  EKG Interpretation   None       MDM   Final diagnoses:  None    Contusion of the left foot.  If foot swells, turns blue or white or has decreased sensation return to the closest ED.  Ice and elevate the foot.    I personally performed the services described in this documentation, which was scribed in my presence. The recorded information has been reviewed and is accurate.    Marty Uy Alfonso Patten, MD 02/24/13 [phone]

## 2013-02-24 ENCOUNTER — Emergency Department (HOSPITAL_BASED_OUTPATIENT_CLINIC_OR_DEPARTMENT_OTHER): Payer: Worker's Compensation

## 2013-02-24 MED ORDER — NAPROXEN 250 MG PO TABS
500.0000 mg | ORAL_TABLET | Freq: Once | ORAL | Status: DC
Start: 2013-02-24 — End: 2013-02-24

## 2013-02-24 MED ORDER — TRAMADOL HCL 50 MG PO TABS: 50.0000 mg | ORAL_TABLET | Freq: Once | ORAL | Status: DC

## 2013-02-24 NOTE — ED Notes (Signed)
I applied bacitracin /wound care to patient's left heel. I then wrapped 4x4 covering with kerlix wrap, secured all with tape. I then applied post op shoe. Patient is ready for discharge.

## 2013-05-21 ENCOUNTER — Emergency Department (HOSPITAL_COMMUNITY)
Admission: EM | Admit: 2013-05-21 | Discharge: 2013-05-21 | Disposition: A | Discharge status: Home / Self Care | Payer: BC Managed Care – PPO | Attending: Emergency Medicine | Admitting: Emergency Medicine

## 2013-05-21 ENCOUNTER — Encounter (HOSPITAL_COMMUNITY): Payer: Self-pay | Admitting: Emergency Medicine

## 2013-05-21 DIAGNOSIS — M25519 Pain in unspecified shoulder: Secondary | ICD-10-CM | POA: Insufficient documentation

## 2013-05-21 DIAGNOSIS — M79609 Pain in unspecified limb: Secondary | ICD-10-CM | POA: Insufficient documentation

## 2013-05-21 DIAGNOSIS — Z79899 Other long term (current) drug therapy: Secondary | ICD-10-CM | POA: Insufficient documentation

## 2013-05-21 DIAGNOSIS — E7800 Pure hypercholesterolemia, unspecified: Secondary | ICD-10-CM | POA: Insufficient documentation

## 2013-05-21 DIAGNOSIS — Z872 Personal history of diseases of the skin and subcutaneous tissue: Secondary | ICD-10-CM | POA: Insufficient documentation

## 2013-05-21 DIAGNOSIS — Z792 Long term (current) use of antibiotics: Secondary | ICD-10-CM | POA: Insufficient documentation

## 2013-05-21 DIAGNOSIS — Z9889 Other specified postprocedural states: Secondary | ICD-10-CM | POA: Insufficient documentation

## 2013-05-21 DIAGNOSIS — I10 Essential (primary) hypertension: Secondary | ICD-10-CM | POA: Insufficient documentation

## 2013-05-21 DIAGNOSIS — M79603 Pain in arm, unspecified: Secondary | ICD-10-CM

## 2013-05-21 DIAGNOSIS — M542 Cervicalgia: Secondary | ICD-10-CM | POA: Insufficient documentation

## 2013-05-21 DIAGNOSIS — E119 Type 2 diabetes mellitus without complications: Secondary | ICD-10-CM | POA: Insufficient documentation

## 2013-05-21 DIAGNOSIS — Z7982 Long term (current) use of aspirin: Secondary | ICD-10-CM | POA: Insufficient documentation

## 2013-05-21 HISTORY — DX: Essential (primary) hypertension: I10

## 2013-05-21 MED ORDER — OXYCODONE-ACETAMINOPHEN 5-325 MG PO TABS: 1.0000 | ORAL_TABLET | ORAL | Status: AC | PRN

## 2013-05-21 NOTE — Discharge Instructions (Signed)
Cryotherapy Cryotherapy means treatment with cold. Ice or gel packs can be used to reduce both pain and swelling. Ice is the most helpful within the first 24 to 48 hours after an injury or flareup from overusing a muscle or joint. Sprains, strains, spasms, burning pain, shooting pain, and aches can all be eased with ice. Ice can also be used when recovering from surgery. Ice is effective, has very few side effects, and is safe for most people to use. PRECAUTIONS  Ice is not a safe treatment option for people with:  Raynaud's phenomenon. This is a condition affecting small blood vessels in the extremities. Exposure to cold may cause your problems to return.  Cold hypersensitivity. There are many forms of cold hypersensitivity, including:  Cold urticaria. Red, itchy hives appear on the skin when the tissues begin to warm after being iced.  Cold erythema. This is a red, itchy rash caused by exposure to cold.  Cold hemoglobinuria. Red blood cells break down when the tissues begin to warm after being iced. The hemoglobin that carry oxygen are passed into the urine because they cannot combine with blood proteins fast enough.  Numbness or altered sensitivity in the area being iced. If you have any of the following conditions, do not use ice until you have discussed cryotherapy with your caregiver:  Heart conditions, such as arrhythmia, angina, or chronic heart disease.  High blood pressure.  Healing wounds or open skin in the area being iced.  Current infections.  Rheumatoid arthritis.  Poor circulation.  Diabetes. Ice slows the blood flow in the region it is applied. This is beneficial when trying to stop inflamed tissues from spreading irritating chemicals to surrounding tissues. However, if you expose your skin to cold temperatures for too long or without the proper protection, you can damage your skin or nerves. Watch for signs of skin damage due to cold. HOME CARE INSTRUCTIONS Follow  these tips to use ice and cold packs safely.  Place a dry or damp towel between the ice and skin. A damp towel will cool the skin more quickly, so you may need to shorten the time that the ice is used.  For a more rapid response, add gentle compression to the ice.  Ice for no more than 10 to 20 minutes at a time. The bonier the area you are icing, the less time it will take to get the benefits of ice.  Check your skin after 5 minutes to make sure there are no signs of a poor response to cold or skin damage.  Rest 20 minutes or more in between uses.  Once your skin is numb, you can end your treatment. You can test numbness by very lightly touching your skin. The touch should be so light that you do not see the skin dimple from the pressure of your fingertip. When using ice, most people will feel these normal sensations in this order: cold, burning, aching, and numbness.  Do not use ice on someone who cannot communicate their responses to pain, such as small children or people with dementia. HOW TO MAKE AN ICE PACK Ice packs are the most common way to use ice therapy. Other methods include ice massage, ice baths, and cryo-sprays. Muscle creams that cause a cold, tingly feeling do not offer the same benefits that ice offers and should not be used as a substitute unless recommended by your caregiver. To make an ice pack, do one of the following:  Place crushed ice or  a bag of frozen vegetables in a sealable plastic bag. Squeeze out the excess air. Place this bag inside another plastic bag. Slide the bag into a pillowcase or place a damp towel between your skin and the bag.  Mix 3 parts water with 1 part rubbing alcohol. Freeze the mixture in a sealable plastic bag. When you remove the mixture from the freezer, it will be slushy. Squeeze out the excess air. Place this bag inside another plastic bag. Slide the bag into a pillowcase or place a damp towel between your skin and the bag. SEEK MEDICAL  CARE IF:  You develop white spots on your skin. This may give the skin a blotchy (mottled) appearance.  Your skin turns blue or pale.  Your skin becomes waxy or hard.  Your swelling gets worse. MAKE SURE YOU:   Understand these instructions.  Will watch your condition.  Will get help right away if you are not doing well or get worse. Document Released: 08/23/2010 Document Revised: 03/21/2011 Document Reviewed: 08/23/2010 [facility] Patient Information 2014 [place], [place]. Muscle Cramps and Spasms Muscle cramps and spasms occur when a muscle or muscles tighten and you have no control over this tightening (involuntary muscle contraction). They are a common problem and can develop in any muscle. The most common place is in the calf muscles of the leg. Both muscle cramps and muscle spasms are involuntary muscle contractions, but they also have differences:   Muscle cramps are sporadic and painful. They may last a few seconds to a quarter of an hour. Muscle cramps are often more forceful and last longer than muscle spasms.  Muscle spasms may or may not be painful. They may also last just a few seconds or much longer. CAUSES  It is uncommon for cramps or spasms to be due to a serious underlying problem. In many cases, the cause of cramps or spasms is unknown. Some common causes are:   Overexertion.   Overuse from repetitive motions (doing the same thing over and over).   Remaining in a certain position for a long period of time.   Improper preparation, form, or technique while performing a sport or activity.   Dehydration.   Injury.   Side effects of some medicines.   Abnormally low levels of the salts and ions in your blood (electrolytes), especially potassium and calcium. This could happen if you are taking water pills (diuretics) or you are pregnant.  Some underlying medical problems can make it more likely to develop cramps or spasms. These include, but are not limited  to:   Diabetes.   Parkinson disease.   Hormone disorders, such as thyroid problems.   Alcohol abuse.   Diseases specific to muscles, joints, and bones.   Blood vessel disease where not enough blood is getting to the muscles.  HOME CARE INSTRUCTIONS   Stay well hydrated. Drink enough water and fluids to keep your urine clear or pale yellow.  It may be helpful to massage, stretch, and relax the affected muscle.  For tight or tense muscles, use a warm towel, heating pad, or hot shower water directed to the affected area.  If you are sore or have pain after a cramp or spasm, applying ice to the affected area may relieve discomfort.  Put ice in a plastic bag.  Place a towel between your skin and the bag.  Leave the ice on for 15-20 minutes, 03-04 times a day.  Medicines used to treat a known cause of cramps or  spasms may help reduce their frequency or severity. Only take over-the-counter or prescription medicines as directed by your caregiver. SEEK MEDICAL CARE IF:  Your cramps or spasms get more severe, more frequent, or do not improve over time.  MAKE SURE YOU:   Understand these instructions.  Will watch your condition.  Will get help right away if you are not doing well or get worse. Document Released: 06/18/2001 Document Revised: 04/23/2012 Document Reviewed: 12/14/2011 [facility] Patient Information 2014 [place], [place].

## 2013-05-21 NOTE — ED Provider Notes (Signed)
CSN: [id number]     Arrival date & time 05/21/13  1344 History   First MD Initiated Contact with Patient 05/21/13 1403     Chief Complaint  Patient presents with  . Neck Injury  . Shoulder Pain  . Arm Pain     (Consider location/radiation/quality/duration/timing/severity/associated sxs/prior Treatment) Patient is a 53 y.o. male presenting with neck injury, shoulder pain, and arm pain. The history is provided by the patient. No language interpreter was used.  Neck Injury This is a new problem. The current episode started in the past 7 days. Pertinent negatives include no chills, fever, numbness or weakness. Associated symptoms comments: Pain in left neck radiating into shoulder and left arm without known injury. He denies weakness or numbness. He was seen by Dr. Dorthy Cooler yesterday and given a muscle relaxer and naproxen that he reports does not offer any relief. .  Shoulder Pain Pertinent negatives include no chills, fever, numbness or weakness.  Arm Pain Pertinent negatives include no chills, fever, numbness or weakness.    Past Medical History  Diagnosis Date  . Diabetes mellitus   . Hypercholesterolemia   . Acne keloidalis nuchae   . Hypertension    Past Surgical History  Procedure Laterality Date  . Hernia repair    . Cyst removal neck     Family History  Problem Relation Age of Onset  . Diabetes Mother    History  Substance Use Topics  . Smoking status: Never Smoker   . Smokeless tobacco: Never Used  . Alcohol Use: No    Review of Systems  Constitutional: Negative for fever and chills.  Cardiovascular: Negative.   Musculoskeletal:       See HPI  Skin: Negative.  Negative for color change.  Neurological: Negative.  Negative for weakness and numbness.      Allergies  Review of patient's allergies indicates no known allergies.  Home Medications   Prior to Admission medications   Medication Sig Start Date End Date Taking? Authorizing Provider  aspirin 325  MG tablet Take 325 mg by mouth daily.    Historical Provider, MD  clindamycin (CLEOCIN T) 1 % external solution Apply topically daily.      Historical Provider, MD  cyclobenzaprine (FLEXERIL) 10 MG tablet Take 1 tablet (10 mg total) by mouth 2 (two) times daily as needed for muscle spasms. 12/30/11   Tiffany Marilu Favre, PA-C  doxycycline (VIBRAMYCIN) 100 MG capsule Take 100 mg by mouth 2 (two) times daily.      Historical Provider, MD  glipiZIDE (GLUCOTROL XL) 5 MG 24 hr tablet Take 5 mg by mouth daily.    Historical Provider, MD  ONE TOUCH ULTRA TEST test strip 1 each by Other route Daily.  09/22/10   Historical Provider, MD  oxyCODONE-acetaminophen (PERCOCET/ROXICET) 5-325 MG per tablet Take 1 tablet by mouth every 6 (six) hours as needed for pain. 12/30/11   Tiffany Marilu Favre, PA-C  pioglitazone-metformin (ACTOPLUS MET) 15-500 MG per tablet Take 1 tablet by mouth 2 (two) times daily with a meal.    Historical Provider, MD  simvastatin (ZOCOR) 40 MG tablet Take 40 mg by mouth every evening.    Historical Provider, MD   BP 134/89  Pulse 73  Temp(Src) 97.6 F (36.4 C) (Oral)  Resp 19  SpO2 96% Physical Exam  Constitutional: He is oriented to person, place, and time. He appears well-developed and well-nourished.  Neck: Normal range of motion.  Cardiovascular: Intact distal pulses.   Pulmonary/Chest: Effort normal.  Musculoskeletal: Normal range of motion.  No midline cervical tenderness. There is left paracervical tenderness without swelling. Painful full ROM. No reproducible tenderness of left upper extremity. No strength deficits of left arm.   Neurological: He is alert and oriented to person, place, and time.  Skin: Skin is warm and dry.  Psychiatric: He has a normal mood and affect.    ED Course  Procedures (including critical care time) Labs Review Labs Reviewed - No data to display  Imaging Review No results found.   EKG Interpretation None      MDM   Final diagnoses:   None    1. Arm pain  DDx: cervical radiculopathy vs musculoskeletal pain. Will offer pain medications and encourage PCP follow up.    Dewaine Oats, PA-C 05/23/13 2254

## 2013-05-21 NOTE — ED Notes (Signed)
Pt c/o neck pain that radiates down left shoulder and left arm since Saturday. Pt sates that he went to PCP yesterday and was given shot in office, and prescription for muscle relaxer and pain meds. Pt states that he took them yesterday and today but not getting any better. Pt called PCP but hasnt heard back from them.

## 2013-05-24 NOTE — ED Provider Notes (Signed)
Medical screening examination/treatment/procedure(s) were performed by non-physician practitioner and as supervising physician I was immediately available for consultation/collaboration.   EKG Interpretation None        Blanchie Dessert, MD 05/24/13 [phone]

## 2013-06-07 ENCOUNTER — Other Ambulatory Visit: Payer: Self-pay | Admitting: Family Medicine

## 2013-06-07 DIAGNOSIS — M5412 Radiculopathy, cervical region: Secondary | ICD-10-CM

## 2013-06-14 ENCOUNTER — Ambulatory Visit
Admission: RE | Admit: 2013-06-14 | Discharge: 2013-06-14 | Disposition: A | Discharge status: Home / Self Care | Payer: BC Managed Care – PPO | Source: Ambulatory Visit | Attending: Family Medicine | Admitting: Family Medicine

## 2013-06-14 ENCOUNTER — Other Ambulatory Visit: Payer: Self-pay | Admitting: Family Medicine

## 2013-06-14 DIAGNOSIS — M5412 Radiculopathy, cervical region: Secondary | ICD-10-CM

## 2013-06-14 DIAGNOSIS — T1590XA Foreign body on external eye, part unspecified, unspecified eye, initial encounter: Secondary | ICD-10-CM

## 2013-12-21 IMAGING — CR DG LUMBAR SPINE COMPLETE 4+V
5 series · 5 of 5 positions shown · non-contrast
Comparison: None.

CLINICAL DATA: Low back pain

LUMBAR SPINE - COMPLETE 4+ VIEW

[t lumbar spine ap]
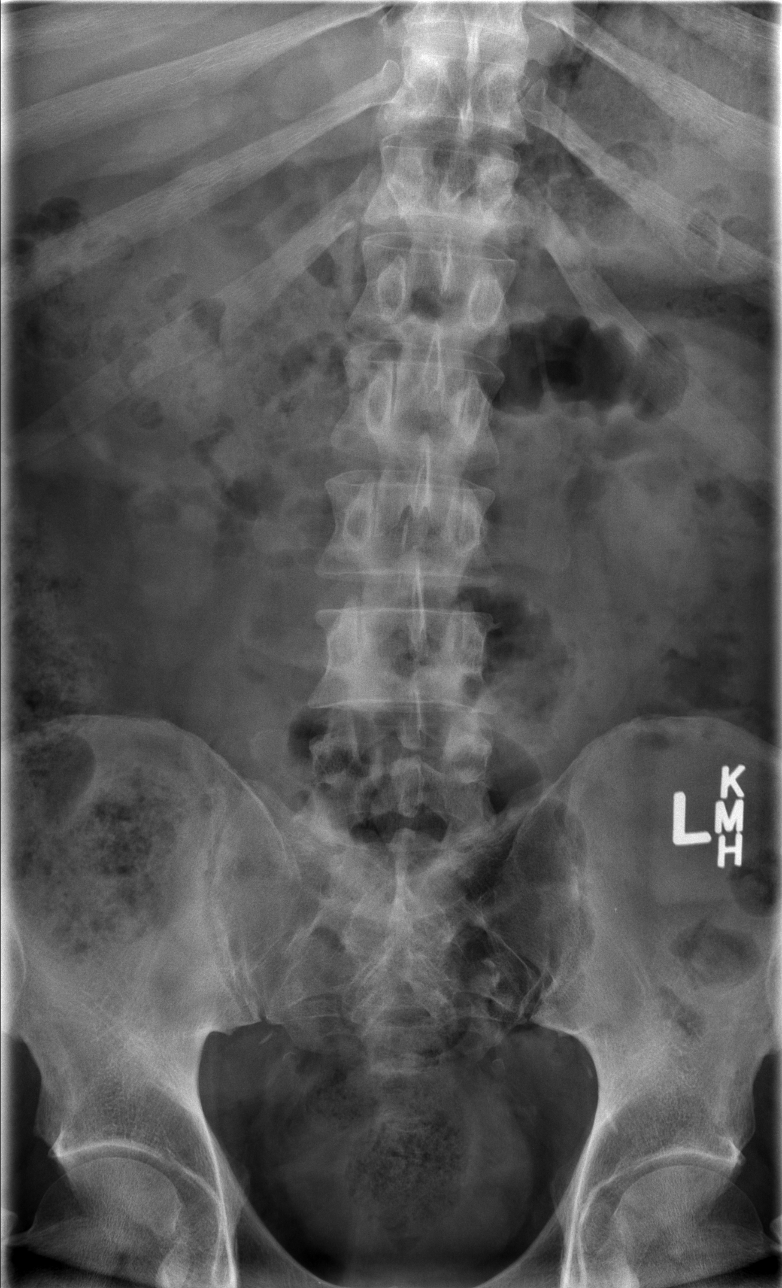

[t lumbar spine obl (1 of 2)]
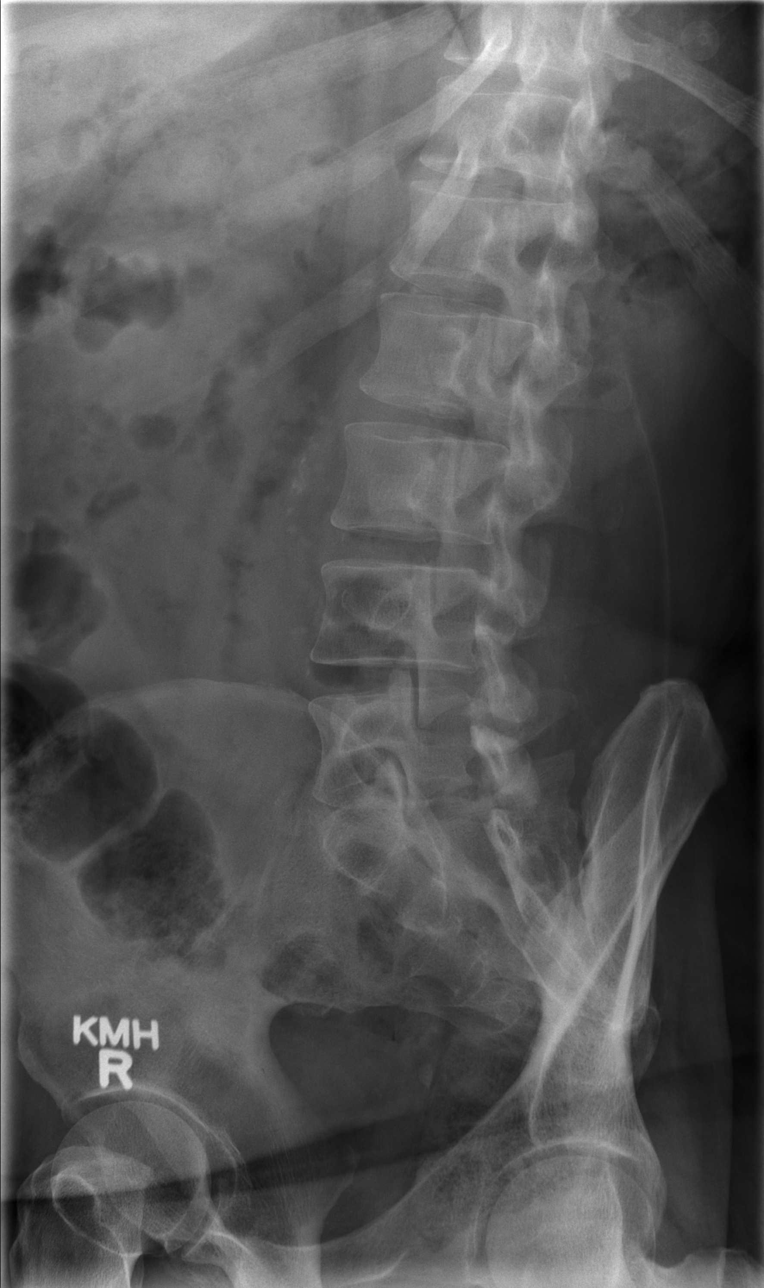

[t lumbar spine obl (2 of 2)]
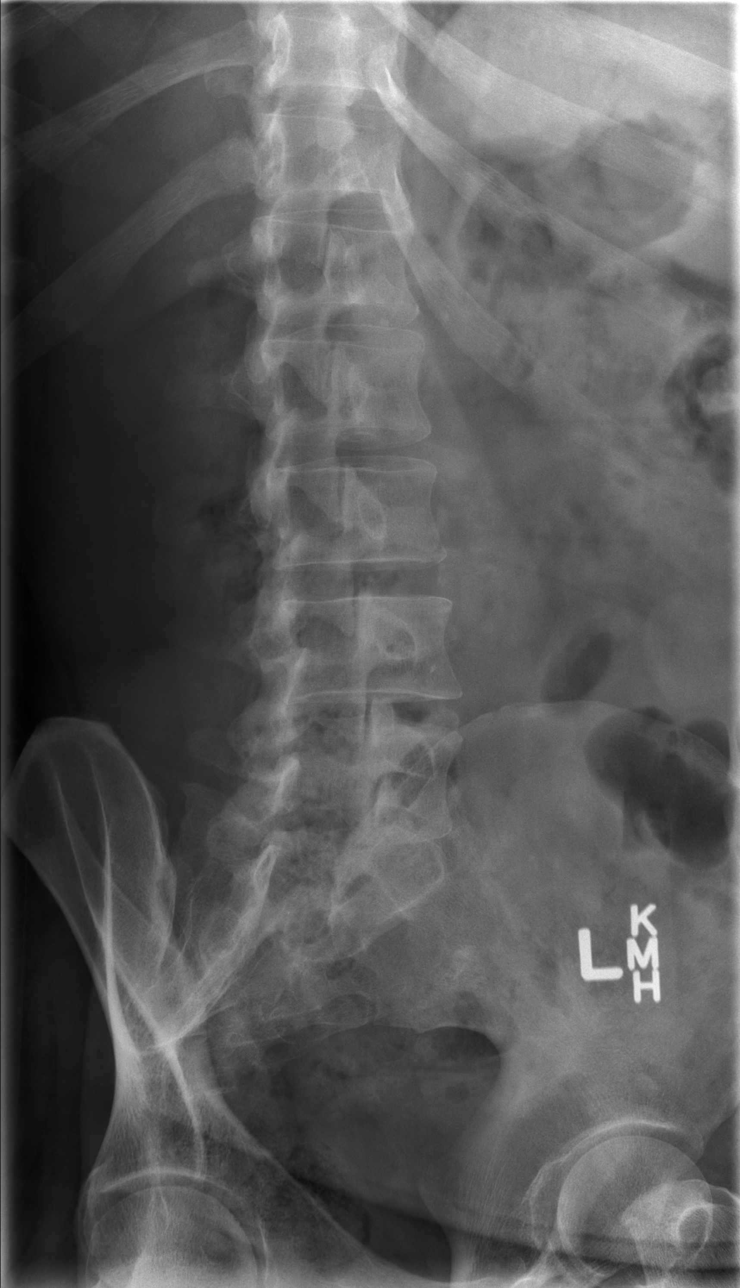

[t lumbar spine lat]
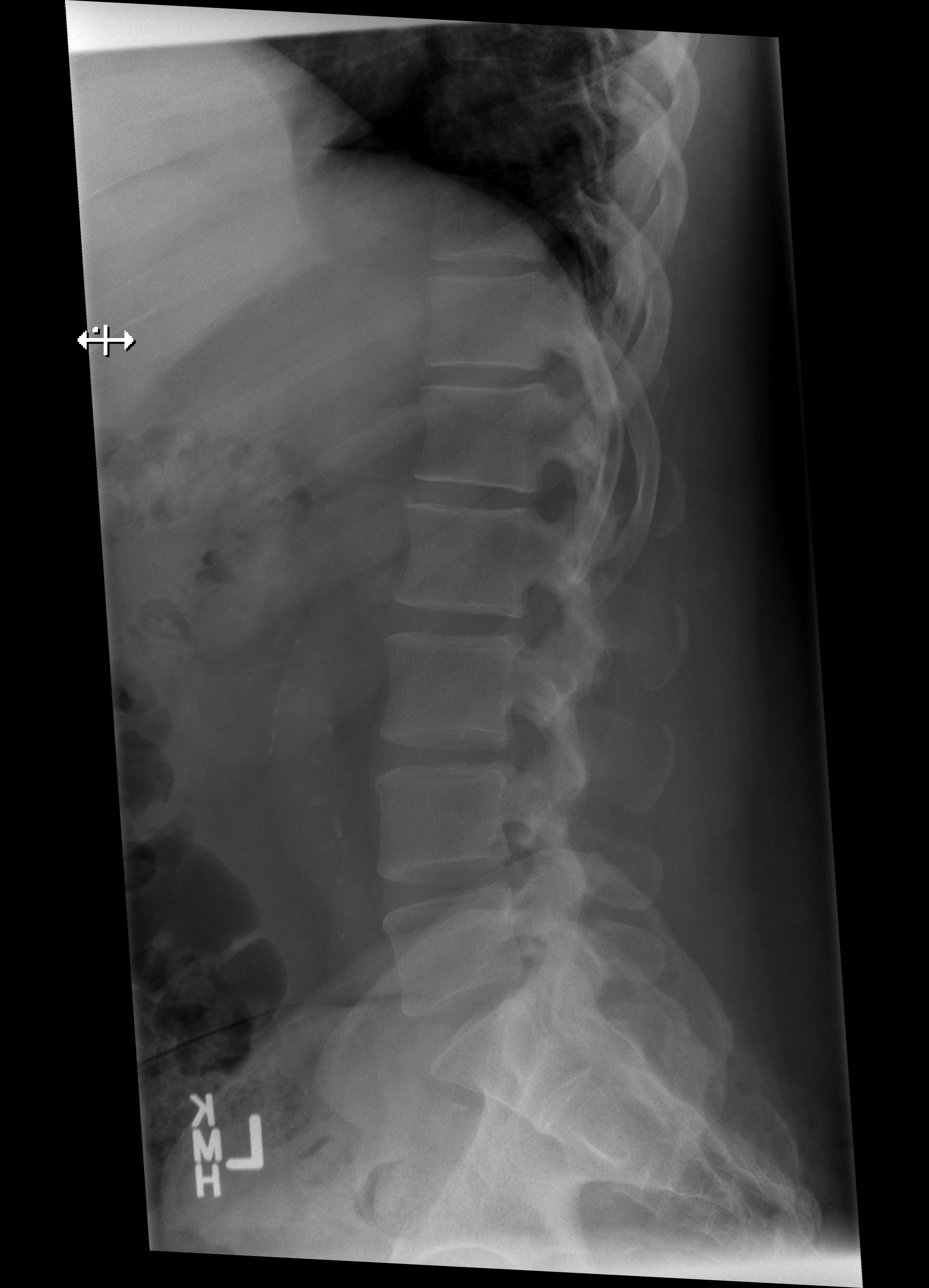

[t lumbar l-5 s-1 spot]
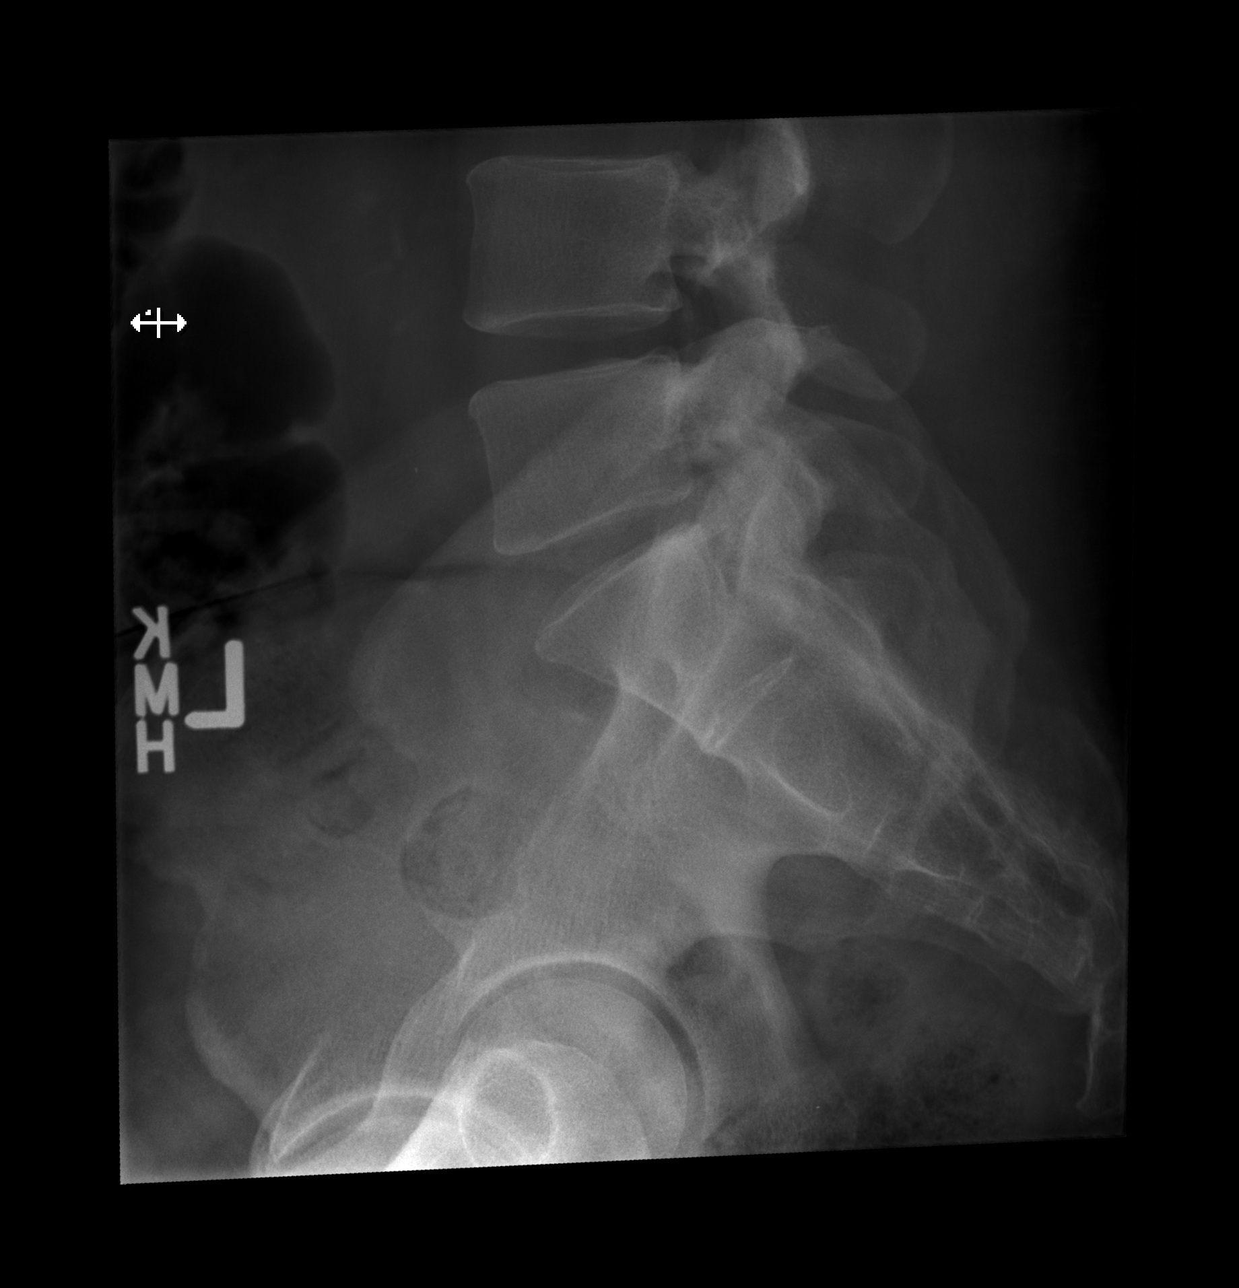

[5 of 5 positions shown; findings below may reference images not displayed]

FINDINGS: Patchy aortic calcifications.  No suggestion of aneurysm.
There is no evidence of lumbar spine fracture.  Alignment is
normal.  Intervertebral disc spaces are maintained.
IMPRESSION: Negative.

## 2016-08-09 ENCOUNTER — Other Ambulatory Visit: Payer: Self-pay | Admitting: Physician Assistant

## 2016-08-09 DIAGNOSIS — R1310 Dysphagia, unspecified: Secondary | ICD-10-CM

## 2016-08-17 ENCOUNTER — Ambulatory Visit
Admission: RE | Admit: 2016-08-17 | Discharge: 2016-08-17 | Disposition: A | Discharge status: Home / Self Care | Payer: BLUE CROSS/BLUE SHIELD | Source: Ambulatory Visit | Attending: Physician Assistant | Admitting: Physician Assistant

## 2016-08-17 DIAGNOSIS — R1310 Dysphagia, unspecified: Secondary | ICD-10-CM

## 2016-10-13 ENCOUNTER — Encounter: Payer: Self-pay | Admitting: Gastroenterology

## 2016-12-08 ENCOUNTER — Encounter: Payer: Self-pay | Admitting: Gastroenterology

## 2016-12-08 ENCOUNTER — Encounter (INDEPENDENT_AMBULATORY_CARE_PROVIDER_SITE_OTHER): Payer: Self-pay

## 2016-12-08 ENCOUNTER — Ambulatory Visit (INDEPENDENT_AMBULATORY_CARE_PROVIDER_SITE_OTHER): Payer: BLUE CROSS/BLUE SHIELD | Admitting: Gastroenterology

## 2016-12-08 VITALS — BP 118/72 | HR 55 | Ht 67.0 in | Wt 280.0 lb

## 2016-12-08 DIAGNOSIS — Z1212 Encounter for screening for malignant neoplasm of rectum: Secondary | ICD-10-CM | POA: Diagnosis not present

## 2016-12-08 DIAGNOSIS — R1319 Other dysphagia: Secondary | ICD-10-CM | POA: Diagnosis not present

## 2016-12-08 DIAGNOSIS — K219 Gastro-esophageal reflux disease without esophagitis: Secondary | ICD-10-CM | POA: Diagnosis not present

## 2016-12-08 DIAGNOSIS — Z1211 Encounter for screening for malignant neoplasm of colon: Secondary | ICD-10-CM

## 2016-12-08 MED ORDER — RANITIDINE HCL 150 MG PO TABS: 150.0000 mg | ORAL_TABLET | Freq: Every day | ORAL | 11 refills | Status: DC

## 2016-12-08 NOTE — Progress Notes (Signed)
Brian Kennedy    [id number]    1960/03/19  Primary Care Physician:Koirala, Dibas, MD  Referring Physician: Lujean Amel, MD [address], [place] [address]  Chief complaint: Dysphagia, globus sensation HPI: 56 year old male here for new patient visit with complaints of globus sensation, cough and intermittent dysphagia He saw ENT and was noted to have laryngopharyngeal reflux, started on omeprazole 40 mg daily.  He feels some improvement of symptoms with omeprazole but continues to have intermittent sensation of choking and globus sensation.  He has choking sensation when he swallows food, occurs with both liquids and solids.  Sometimes he has choking even when he is not eating, with strong smell or fragrances.  He has seasonal allergies and postnasal drip.  Denies any nausea, vomiting, abdominal pain, melena or bright red blood per rectum    Outpatient Encounter Medications as of 12/08/2016  Medication Sig  . aspirin EC 81 MG tablet Take 81 mg by mouth every morning.  . cyclobenzaprine (FLEXERIL) 10 MG tablet Take 1 tablet (10 mg total) by mouth 2 (two) times daily as needed for muscle spasms.  Marland Kitchen glipiZIDE (GLUCOTROL XL) 5 MG 24 hr tablet Take 5 mg by mouth every morning.   Marland Kitchen lisinopril-hydrochlorothiazide (PRINZIDE,ZESTORETIC) 20-12.5 MG per tablet Take 1 tablet by mouth every morning.  . naproxen (NAPROSYN) 500 MG tablet Take 500 mg by mouth 2 (two) times daily as needed (For pain.).   Marland Kitchen omeprazole (PRILOSEC) 40 MG capsule TAKE ONE CAPSULE BY MOUTH TWICE A DAY 30 MINUTES BEFORE MORNING AND EVENING MEALS  . ONE TOUCH ULTRA TEST test strip 1 each by Other route Daily.   Marland Kitchen oxyCODONE-acetaminophen (PERCOCET/ROXICET) 5-325 MG per tablet Take 1-2 tablets by mouth every 4 (four) hours as needed.  . pioglitazone (ACTOS) 15 MG tablet Take 15 mg by mouth every morning.  . simvastatin (ZOCOR) 40 MG tablet Take 40 mg by mouth every evening.  Marland Kitchen  tetrahydrozoline 0.05 % ophthalmic solution Place 1 drop into both eyes 4 (four) times daily as needed (For eye irritation.).   No facility-administered encounter medications on file as of 12/08/2016.     Allergies as of 12/08/2016  . (No Known Allergies)    Past Medical History:  Diagnosis Date  . Acne keloidalis nuchae   . Diabetes mellitus   . Hypercholesterolemia   . Hypertension     Past Surgical History:  Procedure Laterality Date  . CYST REMOVAL NECK    . HERNIA REPAIR      Family History  Problem Relation Age of Onset  . Diabetes Mother   . Breast cancer Mother   . Stomach cancer Neg Hx   . Colon cancer Neg Hx     Social History   Socioeconomic History  . Marital status: Single    Spouse name: Not on file  . Number of children: Not on file  . Years of education: Not on file  . Highest education level: Not on file  Social Needs  . Financial resource strain: Not on file  . Food insecurity - worry: Not on file  . Food insecurity - inability: Not on file  . Transportation needs - medical: Not on file  . Transportation needs - non-medical: Not on file  Occupational History  . Not on file  Tobacco Use  . Smoking status: Never Smoker  . Smokeless tobacco: Never Used  Substance and Sexual Activity  . Alcohol use:  No  . Drug use: No  . Sexual activity: Not Currently  Other Topics Concern  . Not on file  Social History Narrative  . Not on file      Review of systems: Review of Systems  Constitutional: Negative for fever and chills.  HENT: Negative.   Eyes: Negative for blurred vision.  Respiratory: Negative for cough, shortness of breath and wheezing.   Cardiovascular: Negative for chest pain and palpitations.  Gastrointestinal: as per HPI Genitourinary: Negative for dysuria, urgency, frequency and hematuria.  Musculoskeletal: Positive for myalgias, back pain and joint pain.  Skin: Negative for itching and rash.  Neurological: Negative for  dizziness, tremors, focal weakness, seizures and loss of consciousness.  Endo/Heme/Allergies: Positive for seasonal allergies.  Psychiatric/Behavioral: Negative for depression, suicidal ideas and hallucinations.  All other systems reviewed and are negative.   Physical Exam: Vitals:   12/08/16 0900  BP: 118/72  Pulse: (!) 55   Body mass index is 43.85 kg/m. Gen:      No acute distress HEENT:  EOMI, sclera anicteric Neck:     No masses; no thyromegaly Lungs:    Clear to auscultation bilaterally; normal respiratory effort CV:         Regular rate and rhythm; no murmurs Abd:      + bowel sounds; soft, non-tender; no palpable masses, no distension Ext:    No edema; adequate peripheral perfusion Skin:      Warm and dry; no rash Neuro: alert and oriented x 3 Psych: normal mood and affect  Data Reviewed:  Reviewed labs, radiology imaging, old records and pertinent past GI work up   Assessment and Plan/Recommendations:  56 year old male with complaints of globus sensation, persistent reflux symptoms despite omeprazole daily and intermittent dysphagia Will schedule for EGD for evaluation Continue omeprazole 40 mg daily Start ranitidine 150 mg at bedtime as needed for nocturnal symptoms Discussed antireflux measures and lifestyle modification in detail   Patient had colonoscopy about 5 years ago at [facility], he received a letter that he is due to schedule recall colonoscopy  We will try to obtain the records of prior colonoscopy and schedule both EGD and colonoscopy together The risks and benefits as well as alternatives of endoscopic procedure(s) have been discussed and reviewed. All questions answered. The patient agrees to proceed.    Damaris Hippo , MD [phone] Mon-Fri 8a-5p [phone] after 5p, weekends, holidays  CC: Koirala, Dibas, MD

## 2016-12-08 NOTE — Patient Instructions (Signed)
You have been scheduled for an endoscopy. Please follow written instructions given to you at your visit today. If you use inhalers (even only as needed), please bring them with you on the day of your procedure. Your physician has requested that you go to www.startemmi.com and enter the access code given to you at your visit today. This web site gives a general overview about your procedure. However, you should still follow specific instructions given to you by our office regarding your preparation for the procedure.  Continue omeprazole 40 mg daily before breakfast  We will send in Zantac to use daily at bedtime to your pharmacy  Follow up in 6 months   Food Choices for Gastroesophageal Reflux Disease, Adult When you have gastroesophageal reflux disease (GERD), the foods you eat and your eating habits are very important. Choosing the right foods can help ease your discomfort. What guidelines do I need to follow?  Choose fruits, vegetables, whole grains, and low-fat dairy products.  Choose low-fat meat, fish, and poultry.  Limit fats such as oils, salad dressings, butter, nuts, and avocado.  Keep a food diary. This helps you identify foods that cause symptoms.  Avoid foods that cause symptoms. These may be different for everyone.  Eat small meals often instead of 3 large meals a day.  Eat your meals slowly, in a place where you are relaxed.  Limit fried foods.  Cook foods using methods other than frying.  Avoid drinking alcohol.  Avoid drinking large amounts of liquids with your meals.  Avoid bending over or lying down until 2-3 hours after eating. What foods are not recommended? These are some foods and drinks that may make your symptoms worse: Vegetables Tomatoes. Tomato juice. Tomato and spaghetti sauce. Chili peppers. Onion and garlic. Horseradish. Fruits Oranges, grapefruit, and lemon (fruit and juice). Meats High-fat meats, fish, and poultry. This includes hot dogs,  ribs, ham, sausage, salami, and bacon. Dairy Whole milk and chocolate milk. Sour cream. Cream. Butter. Ice cream. Cream cheese. Drinks Coffee and tea. Bubbly (carbonated) drinks or energy drinks. Condiments Hot sauce. Barbecue sauce. Sweets/Desserts Chocolate and cocoa. Donuts. Peppermint and spearmint. Fats and Oils High-fat foods. This includes [place] fries and potato chips. Other Vinegar. Strong spices. This includes black pepper, white pepper, red pepper, cayenne, curry powder, cloves, ginger, and chili powder. The items listed above may not be a complete list of foods and drinks to avoid. Contact your dietitian for more information. This information is not intended to replace advice given to you by your health care provider. Make sure you discuss any questions you have with your health care provider. Document Released: 06/28/2011 Document Revised: 06/04/2015 Document Reviewed: 10/31/2012 Elsevier Interactive Patient Education  2017 [employer].

## 2016-12-12 ENCOUNTER — Telehealth: Payer: Self-pay | Admitting: *Deleted

## 2016-12-12 ENCOUNTER — Other Ambulatory Visit: Payer: Self-pay | Admitting: *Deleted

## 2016-12-12 DIAGNOSIS — Z86010 Personal history of colonic polyps: Secondary | ICD-10-CM

## 2016-12-12 NOTE — Telephone Encounter (Signed)
Also contacted natalie and also Amy hazelwood so she can do the precert for the colonoscopy

## 2016-12-12 NOTE — Telephone Encounter (Signed)
===  View-only below this line===  ----- Message ----- From: Mauri Pole, MD Sent: 12/12/2016  10:02 AM To: Oda Kilts, CMA, Greggory Keen, LPN  Please check if he can come in tomorrow for the EGD and colonoscopy. We have 2 open spots at 10 and 11 AM, may be we can try to consolidate them together if a patient is willing to move .  Thank you VN ----- Message ----- From: Oda Kilts, CMA Sent: 12/12/2016   8:34 AM To: Mauri Pole, MD  Dr Silverio Decamp his EGD is scheduled for tomorrow, not sure he is going to want to reschedule his EGD  To a later date  Its kind of short notice  ----- Message ----- From: Mauri Pole, MD Sent: 12/09/2016  12:53 PM To: Oda Kilts, CMA, Greggory Keen, LPN  Patient received a letter from [facility] that he is due for colonoscopy. Can we reschedule EGD and do both EGD and colonoscopy together? Please try to get records of prior colonoscopy from [facility]. Thank you VN  Patient aggreed to have colonoscopy tomorrow added to his endo  Will come today for instructions and prep kit and sign paperwork

## 2016-12-13 ENCOUNTER — Ambulatory Visit (AMBULATORY_SURGERY_CENTER): Payer: BLUE CROSS/BLUE SHIELD | Admitting: Gastroenterology

## 2016-12-13 ENCOUNTER — Other Ambulatory Visit: Payer: Self-pay

## 2016-12-13 ENCOUNTER — Encounter: Payer: BLUE CROSS/BLUE SHIELD | Admitting: Gastroenterology

## 2016-12-13 ENCOUNTER — Encounter: Payer: Self-pay | Admitting: Gastroenterology

## 2016-12-13 VITALS — BP 154/82 | HR 62 | Temp 97.8°F | Resp 13 | Ht 67.0 in | Wt 280.0 lb

## 2016-12-13 DIAGNOSIS — K2970 Gastritis, unspecified, without bleeding: Secondary | ICD-10-CM | POA: Diagnosis not present

## 2016-12-13 DIAGNOSIS — D123 Benign neoplasm of transverse colon: Secondary | ICD-10-CM | POA: Diagnosis not present

## 2016-12-13 DIAGNOSIS — Z1211 Encounter for screening for malignant neoplasm of colon: Secondary | ICD-10-CM

## 2016-12-13 DIAGNOSIS — K2990 Gastroduodenitis, unspecified, without bleeding: Secondary | ICD-10-CM | POA: Diagnosis not present

## 2016-12-13 DIAGNOSIS — K219 Gastro-esophageal reflux disease without esophagitis: Secondary | ICD-10-CM | POA: Diagnosis not present

## 2016-12-13 DIAGNOSIS — D124 Benign neoplasm of descending colon: Secondary | ICD-10-CM

## 2016-12-13 MED ORDER — SODIUM CHLORIDE 0.9 % IV SOLN: 500.0000 mL | INTRAVENOUS | Status: DC

## 2016-12-13 NOTE — Patient Instructions (Signed)
INFORMATION ON GASTRITIS AND GASTRO ESOPHAGEAL REFLUX GIVEN TO YOU TODAY  INFORMATION ON POLYPS AND DIVERTICULOSIS AND HEMORRHOIDS GIVEN TO YOU TODAY   AWAIT PATHOLOGY RESULTS IN A LETTER FROM DR NANDIGAM     YOU HAD AN ENDOSCOPIC PROCEDURE TODAY AT [place]:   Refer to the procedure report that was given to you for any specific questions about what was found during the examination.  If the procedure report does not answer your questions, please call your gastroenterologist to clarify.  If you requested that your care partner not be given the details of your procedure findings, then the procedure report has been included in a sealed envelope for you to review at your convenience later.  YOU SHOULD EXPECT: Some feelings of bloating in the abdomen. Passage of more gas than usual.  Walking can help get rid of the air that was put into your GI tract during the procedure and reduce the bloating. If you had a lower endoscopy (such as a colonoscopy or flexible sigmoidoscopy) you may notice spotting of blood in your stool or on the toilet paper. If you underwent a bowel prep for your procedure, you may not have a normal bowel movement for a few days.  Please Note:  You might notice some irritation and congestion in your nose or some drainage.  This is from the oxygen used during your procedure.  There is no need for concern and it should clear up in a day or so.  SYMPTOMS TO REPORT IMMEDIATELY:   Following lower endoscopy (colonoscopy or flexible sigmoidoscopy):  Excessive amounts of blood in the stool  Significant tenderness or worsening of abdominal pains  Swelling of the abdomen that is new, acute  Fever of 100F or higher   Following upper endoscopy (EGD)  Vomiting of blood or coffee ground material  New chest pain or pain under the shoulder blades  Painful or persistently difficult swallowing  New shortness of breath  Fever of 100F or higher  Black, tarry-looking  stools  For urgent or emergent issues, a gastroenterologist can be reached at any hour by calling [phone].   DIET:  We do recommend a small meal at first, but then you may proceed to your regular diet.  Drink plenty of fluids but you should avoid alcoholic beverages for 24 hours.  ACTIVITY:  You should plan to take it easy for the rest of today and you should NOT DRIVE or use heavy machinery until tomorrow (because of the sedation medicines used during the test).    FOLLOW UP: Our staff will call the number listed on your records the next business day following your procedure to check on you and address any questions or concerns that you may have regarding the information given to you following your procedure. If we do not reach you, we will leave a message.  However, if you are feeling well and you are not experiencing any problems, there is no need to return our call.  We will assume that you have returned to your regular daily activities without incident.  If any biopsies were taken you will be contacted by phone or by letter within the next 1-3 weeks.  Please call us at [phone] if you have not heard about the biopsies in 3 weeks.    SIGNATURES/CONFIDENTIALITY: You and/or your care partner have signed paperwork which will be entered into your electronic medical record.  These signatures attest to the fact that that the information above on  your After Visit Summary has been reviewed and is understood.  Full responsibility of the confidentiality of this discharge information lies with you and/or your care-partner. 

## 2016-12-13 NOTE — Progress Notes (Signed)
Pt's states no medical or surgical changes since previsit or office visit. 

## 2016-12-13 NOTE — Op Note (Signed)
[place] Patient Name: Brian Kennedy Procedure Date: 12/13/2016 9:39 AM MRN: [id number] Endoscopist: Mauri Pole , MD Age: 56 Referring MD:  Date of Birth: 09/19/1960 Gender: Male Account #: [id number] Procedure:                Upper GI endoscopy Indications:              Dysphagia, Esophageal reflux symptoms that persist                            despite appropriate therapy Medicines:                Monitored Anesthesia Care Procedure:                Pre-Anesthesia Assessment:                           - Prior to the procedure, a History and Physical                            was performed, and patient medications and                            allergies were reviewed. The patient's tolerance of                            previous anesthesia was also reviewed. The risks                            and benefits of the procedure and the sedation                            options and risks were discussed with the patient.                            All questions were answered, and informed consent                            was obtained. ASA Grade Assessment: II - A patient                            with mild systemic disease. After reviewing the                            risks and benefits, the patient was deemed in                            satisfactory condition to undergo the procedure.                           After obtaining informed consent, the endoscope was                            passed under direct vision. Throughout the  procedure, the patient's blood pressure, pulse, and                            oxygen saturations were monitored continuously. The                            Model GIF-HQ190 [id number]) scope was introduced                            through the mouth, and advanced to the second part                            of duodenum. The upper GI endoscopy was                            accomplished without  difficulty. The patient                            tolerated the procedure well. Scope In: Scope Out: Findings:                 Islands of salmon-colored mucosa were present from                            37 to 40 cm. Hiatal narrowing was identified at 40                            cm. The maximum longitudinal extent of these                            esophageal mucosal changes was 3 cm in length.                            Biopsies were taken with a cold forceps for                            histology.                           LA Grade A (one or more mucosal breaks less than 5                            mm, not extending between tops of 2 mucosal folds)                            esophagitis with no bleeding was found 35 to 40 cm                            from the incisors.                           Patchy mild inflammation characterized by  congestion (edema) and erythema was found in the                            gastric antrum. Biopsies were taken with a cold                            forceps for Helicobacter pylori testing.                           The examined duodenum was normal. Complications:            No immediate complications. Estimated Blood Loss:     Estimated blood loss was minimal. Impression:               - Salmon-colored mucosa suspicious for                            short-segment Barrett's esophagus. Biopsied.                           - LA Grade A reflux esophagitis.                           - Gastritis. Biopsied.                           - Normal examined duodenum. Recommendation:           - Patient has a contact number available for                            emergencies. The signs and symptoms of potential                            delayed complications were discussed with the                            patient. Return to normal activities tomorrow.                            Written discharge instructions were provided to  the                            patient.                           - Resume previous diet.                           - Continue present medications.                           - Await pathology results.                           - Follow an antireflux regimen for the rest of the  patient's life. Mauri Pole, MD 12/13/2016 10:13:29 AM This report has been signed electronically.

## 2016-12-13 NOTE — Progress Notes (Signed)
Report to PACU, RN, vss, BBS= Clear.  

## 2016-12-13 NOTE — Op Note (Signed)
[place] Patient Name: Brian Kennedy Procedure Date: 12/13/2016 9:37 AM MRN: [id number] Endoscopist: Mauri Pole , MD Age: 56 Referring MD:  Date of Birth: 1960/05/22 Gender: Male Account #: [id number] Procedure:                Colonoscopy Indications:              High risk colon cancer surveillance: Personal                            history of colonic polyps, Surveillance: Personal                            history of adenomatous polyps on last colonoscopy 5                            years ago Medicines:                Monitored Anesthesia Care Procedure:                Pre-Anesthesia Assessment:                           - Prior to the procedure, a History and Physical                            was performed, and patient medications and                            allergies were reviewed. The patient's tolerance of                            previous anesthesia was also reviewed. The risks                            and benefits of the procedure and the sedation                            options and risks were discussed with the patient.                            All questions were answered, and informed consent                            was obtained. Prior Anticoagulants: The patient has                            taken no previous anticoagulant or antiplatelet                            agents. ASA Grade Assessment: II - A patient with                            mild systemic disease. After reviewing the risks  and benefits, the patient was deemed in                            satisfactory condition to undergo the procedure.                           After obtaining informed consent, the colonoscope                            was passed under direct vision. Throughout the                            procedure, the patient's blood pressure, pulse, and                            oxygen saturations were monitored continuously.  The                            Colonoscope was introduced through the anus and                            advanced to the the cecum, identified by                            appendiceal orifice and ileocecal valve. The                            colonoscopy was performed without difficulty. The                            patient tolerated the procedure well. The quality                            of the bowel preparation was adequate. The                            ileocecal valve, appendiceal orifice, and rectum                            were photographed. Scope In: 9:55:53 AM Scope Out: 10:08:17 AM Scope Withdrawal Time: 0 hours 10 minutes 5 seconds  Total Procedure Duration: 0 hours 12 minutes 24 seconds  Findings:                 The perianal and digital rectal examinations were                            normal.                           Two sessile polyps were found in the descending                            colon and transverse colon. The polyps were 5 to 7  mm in size. These polyps were removed with a cold                            snare. Resection and retrieval were complete.                           Multiple small and large-mouthed diverticula were                            found in the sigmoid colon, descending colon,                            transverse colon, ascending colon and cecum. There                            was evidence of diverticular spasm. There was                            evidence of an impacted diverticulum. There was no                            evidence of diverticular bleeding.                           Non-bleeding internal hemorrhoids were found during                            retroflexion. The hemorrhoids were medium-sized. Complications:            No immediate complications. Estimated Blood Loss:     Estimated blood loss was minimal. Impression:               - Two 5 to 7 mm polyps in the descending colon and                             in the transverse colon, removed with a cold snare.                            Resected and retrieved.                           - Moderate diverticulosis in the sigmoid colon, in                            the descending colon, in the transverse colon, in                            the ascending colon and in the cecum. There was                            evidence of diverticular spasm. There was evidence                            of an impacted diverticulum. There was  no evidence                            of diverticular bleeding.                           - Non-bleeding internal hemorrhoids. Recommendation:           - Patient has a contact number available for                            emergencies. The signs and symptoms of potential                            delayed complications were discussed with the                            patient. Return to normal activities tomorrow.                            Written discharge instructions were provided to the                            patient.                           - Resume previous diet.                           - Continue present medications.                           - Await pathology results.                           - Repeat colonoscopy in 5 years for surveillance                            based on pathology results. Mauri Pole, MD 12/13/2016 10:16:36 AM This report has been signed electronically.

## 2016-12-13 NOTE — Progress Notes (Signed)
Called to room to assist during endoscopic procedure.  Patient ID and intended procedure confirmed with present staff. Received instructions for my participation in the procedure from the performing physician.  

## 2016-12-14 ENCOUNTER — Telehealth: Payer: Self-pay | Admitting: *Deleted

## 2016-12-14 NOTE — Telephone Encounter (Signed)
  Follow up Call-  Call back number 12/13/2016  Post procedure Call Back phone  # [phone]  Permission to leave phone message Yes  Some recent data might be hidden     Patient questions:  Do you have a fever, pain , or abdominal swelling? No. Pain Score  0 *  Have you tolerated food without any problems? Yes.    Have you been able to return to your normal activities? Yes.    Do you have any questions about your discharge instructions: Diet   No. Medications  No. Follow up visit  No.  Do you have questions or concerns about your Care? No.  Actions: * If pain score is 4 or above: No action needed, pain <4.

## 2016-12-16 ENCOUNTER — Encounter: Payer: Self-pay | Admitting: Gastroenterology

## 2016-12-23 ENCOUNTER — Other Ambulatory Visit: Payer: Self-pay

## 2016-12-23 MED ORDER — BIS SUBCIT-METRONID-TETRACYC 140-125-125 MG PO CAPS: 3.0000 | ORAL_CAPSULE | Freq: Three times a day (TID) | ORAL | 0 refills | Status: AC

## 2016-12-23 NOTE — Progress Notes (Signed)
Spoke with the patient. Explained his labs. He is on Omeprazole 40 mg BID already. He will continue this. Pylera to [facility]. If that is too expensive, we will call in the alternate treatment.

## 2017-01-20 ENCOUNTER — Telehealth: Payer: Self-pay | Admitting: *Deleted

## 2017-01-20 MED ORDER — RANITIDINE HCL 150 MG PO TABS: 150.0000 mg | ORAL_TABLET | Freq: Every day | ORAL | 4 refills | Status: DC

## 2017-01-20 NOTE — Telephone Encounter (Signed)
90 day supply of zantac sent to pharmacy per fax request from [facility]

## 2017-02-24 ENCOUNTER — Telehealth: Payer: Self-pay | Admitting: Gastroenterology

## 2017-02-24 NOTE — Telephone Encounter (Signed)
Spoke with the patient. He describes a soreness "on both sides at the opening" of the rectum. He seems to have difficulty describing his issues. He states he has a constant discomfort in the rectal area. No increase of pain with bowel movement. Seeing "specks of blood" with his bowel movement at times. Appointment made for evaluation.

## 2017-02-27 ENCOUNTER — Encounter: Payer: Self-pay | Admitting: Physician Assistant

## 2017-02-27 ENCOUNTER — Ambulatory Visit (INDEPENDENT_AMBULATORY_CARE_PROVIDER_SITE_OTHER): Payer: BLUE CROSS/BLUE SHIELD | Admitting: Physician Assistant

## 2017-02-27 VITALS — BP 130/70 | HR 60 | Ht 67.0 in | Wt 293.5 lb

## 2017-02-27 DIAGNOSIS — K648 Other hemorrhoids: Secondary | ICD-10-CM

## 2017-02-27 DIAGNOSIS — K6289 Other specified diseases of anus and rectum: Secondary | ICD-10-CM | POA: Diagnosis not present

## 2017-02-27 DIAGNOSIS — K625 Hemorrhage of anus and rectum: Secondary | ICD-10-CM

## 2017-02-27 MED ORDER — HYDROCORTISONE ACETATE 25 MG RE SUPP: 25.0000 mg | Freq: Two times a day (BID) | RECTAL | 1 refills | Status: AC

## 2017-02-27 MED ORDER — HYDROCORTISONE ACETATE 25 MG RE SUPP: 25.0000 mg | Freq: Two times a day (BID) | RECTAL | 1 refills | Status: DC

## 2017-02-27 NOTE — Patient Instructions (Signed)
We have sent the following medications to your pharmacy for you to pick up at your convenience: Hydrocortisone suppositories twice a day

## 2017-02-27 NOTE — Progress Notes (Signed)
Chief Complaint: Rectal discomfort, Rectal Bleeding  HPI:    [name] is a 57 year old male with a past medical history as listed below, who follows with Dr. Silverio Decamp and presents to clinic today with a complaint of rectal discomfort and bleeding.    Colonoscopy 12/13/16 showed 2 5-7 mm polyps in the descending colon and the transverse colon, moderate diverticulosis in the sigmoid, descending, transverse and ascending colon and in the cecum, evidence of diverticular spasm and an impacted diverticulum with no evidence of diverticular bleeding and nonbleeding internal hemorrhoids.  Path showed tubular adenoma, repeat: 5 years.    Today, describes over the past month he has been having some rectal discomfort/"throbbing".  Occurs on an almost daily basis but has been some better over the past week with use of lotion on his rectal area.  Describes this as a discomfort.  Does note that most of his stools have been loose, but this is not abnormal for him.  Occasional bright red blood on the toilet paper when wiping but seems like it is "irritated".  No additional pain with a bowel movement.  Sometimes feels a lump down there after a bowel movement.  Occasionally feels this while standing and describes as a pressure/throbbing.  Occasional anal seepage on his underwear after passing gas.    Patient denies fever, chills, melena, weight loss or change in diet or medications.   Past Medical History:  Diagnosis Date  . Acne keloidalis nuchae   . Diabetes mellitus   . Hypercholesterolemia   . Hypertension     Past Surgical History:  Procedure Laterality Date  . CYST REMOVAL NECK    . HERNIA REPAIR      Current Outpatient Medications  Medication Sig Dispense Refill  . aspirin EC 81 MG tablet Take 81 mg by mouth every morning.    . bismuth-metronidazole-tetracycline (PYLERA) 140-125-125 MG capsule Take 3 capsules by mouth 4 (four) times daily -  before meals and at bedtime. 120 capsule 0  .  cyclobenzaprine (FLEXERIL) 10 MG tablet Take 1 tablet (10 mg total) by mouth 2 (two) times daily as needed for muscle spasms. 20 tablet 0  . glipiZIDE (GLUCOTROL XL) 5 MG 24 hr tablet Take 5 mg by mouth every morning.     Marland Kitchen omeprazole (PRILOSEC) 40 MG capsule TAKE ONE CAPSULE BY MOUTH TWICE A DAY 30 MINUTES BEFORE MORNING AND EVENING MEALS    . ONE TOUCH ULTRA TEST test strip 1 each by Other route Daily.     Marland Kitchen oxyCODONE-acetaminophen (PERCOCET/ROXICET) 5-325 MG per tablet Take 1-2 tablets by mouth every 4 (four) hours as needed. 15 tablet 0  . pioglitazone (ACTOS) 15 MG tablet Take 15 mg by mouth every morning.    . ranitidine (ZANTAC) 150 MG tablet Take 1 tablet (150 mg total) by mouth at bedtime. 90 tablet 4  . simvastatin (ZOCOR) 40 MG tablet Take 40 mg by mouth every evening.    Marland Kitchen tetrahydrozoline 0.05 % ophthalmic solution Place 1 drop into both eyes 4 (four) times daily as needed (For eye irritation.).    Marland Kitchen valsartan-hydrochlorothiazide (DIOVAN-HCT) 80-12.5 MG tablet daily.     No current facility-administered medications for this visit.     Allergies as of 02/27/2017  . (No Known Allergies)    Family History  Problem Relation Age of Onset  . Diabetes Mother   . Breast cancer Mother   . Stomach cancer Neg Hx   . Colon cancer Neg Hx  Social History   Socioeconomic History  . Marital status: Single    Spouse name: Not on file  . Number of children: 0  . Years of education: Not on file  . Highest education level: Not on file  Social Needs  . Financial resource strain: Not on file  . Food insecurity - worry: Not on file  . Food insecurity - inability: Not on file  . Transportation needs - medical: Not on file  . Transportation needs - non-medical: Not on file  Occupational History  . Not on file  Tobacco Use  . Smoking status: Never Smoker  . Smokeless tobacco: Never Used  Substance and Sexual Activity  . Alcohol use: No  . Drug use: No  . Sexual activity: Not  Currently  Other Topics Concern  . Not on file  Social History Narrative  . Not on file    Review of Systems:    Constitutional: No weight loss, fever or chills Cardiovascular: No chest pain Respiratory: No SOB  Gastrointestinal: See HPI and otherwise negative   Physical Exam:  Vital signs: BP 130/70   Pulse 60   Ht 5\' 7"  (1.702 m)   Wt 293 lb 8 oz (133.1 kg)   BMI 45.97 kg/m   Constitutional:   Pleasant male appears to be in NAD, Well developed, Well nourished, alert and cooperative Respiratory: Respirations even and unlabored. Lungs clear to auscultation bilaterally.   No wheezes, crackles, or rhonchi.  Cardiovascular: Normal S1, S2. No MRG. Regular rate and rhythm. No peripheral edema, cyanosis or pallor.  Gastrointestinal:  Obese. Soft, nondistended, nontender. No rebound or guarding. Normal bowel sounds. No appreciable masses or hepatomegaly. Rectal:  External: no visible irritation or fissure; internal: some ttp; Anoscopy: inflamed grade I-II internal hemorrhoids Psychiatric:  Demonstrates good judgement and reason without abnormal affect or behaviors.  No recent labs.  Assessment: 1.  Rectal discomfort/bleeding: Internal hemorrhoids seen at time of exam, inflamed, likely giving patient his symptoms, recent colonoscopy 12/2016 with 2 tubular adenomas and internal hemorrhoids as well as diverticulosis 2.  Internal hemorrhoids  Plan: 1.  Discussed with patient he had a recent colonoscopy showing internal hemorrhoids which were again visualized at time of anoscopy today.  Likely these are giving him some occasional discomfort. 2.  Prescribed Hydrocortisone suppositories twice daily times 7 days with 1 refill.  Discussed that if the patient uses 2 weeks of suppositories and feels no better he should call our office. 3.  Recommend the patient increase fiber in his diet to help with frequent loose stools. 4.  Patient to follow in clinic with me or Dr. Silverio Decamp as needed in the  future.  Ellouise Newer, PA-C [facility] 02/27/2017, 10:02 AM  Cc: Lujean Amel, MD

## 2017-02-28 NOTE — Progress Notes (Signed)
If he continues to have symptomatic internal hemorrhoids, can consider hemorrhoidal band ligation.   Reviewed and agree with documentation and assessment and plan. Damaris Hippo , MD

## 2017-10-24 DIAGNOSIS — E7800 Pure hypercholesterolemia, unspecified: Secondary | ICD-10-CM | POA: Diagnosis not present

## 2017-10-24 DIAGNOSIS — I10 Essential (primary) hypertension: Secondary | ICD-10-CM | POA: Diagnosis not present

## 2017-10-24 DIAGNOSIS — E119 Type 2 diabetes mellitus without complications: Secondary | ICD-10-CM | POA: Diagnosis not present

## 2017-10-24 DIAGNOSIS — Z125 Encounter for screening for malignant neoplasm of prostate: Secondary | ICD-10-CM | POA: Diagnosis not present

## 2017-10-24 DIAGNOSIS — Z0000 Encounter for general adult medical examination without abnormal findings: Secondary | ICD-10-CM | POA: Diagnosis not present

## 2018-01-30 DIAGNOSIS — E119 Type 2 diabetes mellitus without complications: Secondary | ICD-10-CM | POA: Diagnosis not present

## 2018-02-08 ENCOUNTER — Other Ambulatory Visit: Payer: Self-pay | Admitting: Gastroenterology

## 2018-02-08 DIAGNOSIS — H16251 Phlyctenular keratoconjunctivitis, right eye: Secondary | ICD-10-CM | POA: Diagnosis not present

## 2018-02-12 DIAGNOSIS — H16251 Phlyctenular keratoconjunctivitis, right eye: Secondary | ICD-10-CM | POA: Diagnosis not present

## 2018-05-14 DIAGNOSIS — E119 Type 2 diabetes mellitus without complications: Secondary | ICD-10-CM | POA: Diagnosis not present

## 2018-05-23 DIAGNOSIS — I10 Essential (primary) hypertension: Secondary | ICD-10-CM | POA: Diagnosis not present

## 2018-05-23 DIAGNOSIS — E7800 Pure hypercholesterolemia, unspecified: Secondary | ICD-10-CM | POA: Diagnosis not present

## 2018-05-23 DIAGNOSIS — E119 Type 2 diabetes mellitus without complications: Secondary | ICD-10-CM | POA: Diagnosis not present

## 2018-06-26 DIAGNOSIS — M545 Low back pain: Secondary | ICD-10-CM | POA: Diagnosis not present

## 2018-08-27 DIAGNOSIS — E119 Type 2 diabetes mellitus without complications: Secondary | ICD-10-CM | POA: Diagnosis not present

## 2019-05-29 DIAGNOSIS — I10 Essential (primary) hypertension: Secondary | ICD-10-CM | POA: Diagnosis not present

## 2019-05-29 DIAGNOSIS — E7800 Pure hypercholesterolemia, unspecified: Secondary | ICD-10-CM | POA: Diagnosis not present

## 2019-05-29 DIAGNOSIS — Z23 Encounter for immunization: Secondary | ICD-10-CM | POA: Diagnosis not present

## 2019-05-29 DIAGNOSIS — E1169 Type 2 diabetes mellitus with other specified complication: Secondary | ICD-10-CM | POA: Diagnosis not present

## 2019-05-29 DIAGNOSIS — Z0000 Encounter for general adult medical examination without abnormal findings: Secondary | ICD-10-CM | POA: Diagnosis not present

## 2019-08-16 DIAGNOSIS — Z23 Encounter for immunization: Secondary | ICD-10-CM | POA: Diagnosis not present

## 2019-12-03 DIAGNOSIS — M7918 Myalgia, other site: Secondary | ICD-10-CM | POA: Diagnosis not present

## 2019-12-03 DIAGNOSIS — E118 Type 2 diabetes mellitus with unspecified complications: Secondary | ICD-10-CM | POA: Diagnosis not present

## 2019-12-25 DIAGNOSIS — M25562 Pain in left knee: Secondary | ICD-10-CM | POA: Diagnosis not present

## 2019-12-25 DIAGNOSIS — G5603 Carpal tunnel syndrome, bilateral upper limbs: Secondary | ICD-10-CM | POA: Diagnosis not present

## 2019-12-25 DIAGNOSIS — Z79899 Other long term (current) drug therapy: Secondary | ICD-10-CM | POA: Diagnosis not present

## 2019-12-25 DIAGNOSIS — M62830 Muscle spasm of back: Secondary | ICD-10-CM | POA: Diagnosis not present

## 2019-12-25 DIAGNOSIS — E1169 Type 2 diabetes mellitus with other specified complication: Secondary | ICD-10-CM | POA: Diagnosis not present

## 2019-12-25 DIAGNOSIS — E7800 Pure hypercholesterolemia, unspecified: Secondary | ICD-10-CM | POA: Diagnosis not present

## 2019-12-25 DIAGNOSIS — R209 Unspecified disturbances of skin sensation: Secondary | ICD-10-CM | POA: Diagnosis not present

## 2020-05-25 DIAGNOSIS — R350 Frequency of micturition: Secondary | ICD-10-CM | POA: Diagnosis not present

## 2020-05-25 DIAGNOSIS — E118 Type 2 diabetes mellitus with unspecified complications: Secondary | ICD-10-CM | POA: Diagnosis not present

## 2020-05-25 DIAGNOSIS — G629 Polyneuropathy, unspecified: Secondary | ICD-10-CM | POA: Diagnosis not present

## 2020-05-25 DIAGNOSIS — E7800 Pure hypercholesterolemia, unspecified: Secondary | ICD-10-CM | POA: Diagnosis not present

## 2020-05-27 DIAGNOSIS — Z03818 Encounter for observation for suspected exposure to other biological agents ruled out: Secondary | ICD-10-CM | POA: Diagnosis not present

## 2020-05-27 DIAGNOSIS — Z20822 Contact with and (suspected) exposure to covid-19: Secondary | ICD-10-CM | POA: Diagnosis not present

## 2020-06-02 ENCOUNTER — Encounter: Payer: Self-pay | Admitting: Neurology

## 2020-06-29 DIAGNOSIS — K219 Gastro-esophageal reflux disease without esophagitis: Secondary | ICD-10-CM | POA: Diagnosis not present

## 2020-07-17 ENCOUNTER — Ambulatory Visit (INDEPENDENT_AMBULATORY_CARE_PROVIDER_SITE_OTHER): Payer: BC Managed Care – PPO | Admitting: Neurology

## 2020-07-17 ENCOUNTER — Encounter: Payer: Self-pay | Admitting: Neurology

## 2020-07-17 ENCOUNTER — Other Ambulatory Visit: Payer: Self-pay

## 2020-07-17 VITALS — BP 125/64 | HR 61 | Ht 67.0 in | Wt 281.1 lb

## 2020-07-17 DIAGNOSIS — R202 Paresthesia of skin: Secondary | ICD-10-CM

## 2020-07-17 DIAGNOSIS — E1142 Type 2 diabetes mellitus with diabetic polyneuropathy: Secondary | ICD-10-CM | POA: Diagnosis not present

## 2020-07-17 NOTE — Progress Notes (Signed)
Brian Kennedy   Date: 07/17/20  Brian Kennedy MRN: [id number] DOB: 1960/07/23   Dear Dr. Dorthy Cooler:  Thank you for your kind referral of Brian Kennedy for consultation of numbness/tingling. Although his history is well known to you, please allow [place] to reiterate it for the purpose of our medical record. The patient was accompanied to the clinic by self.    History of Present Illness: Brian Kennedy is a 60 y.o. right-handed male with diabetes mellitus (HbA1c 8.6), hyperlipidemia, and hypertension presenting for evaluation of hand and feet numbness.  Starting around 2020, he began having numbness involving the back of the legs and lateral feet, as well as numbness in the palms of the hands.  He occasioanlly has pain in the legs, which is worse when pressure is applied to his legs. He has weakness with holding objects or opening bottles.  He has some imbalance, no falls.  He takes gabapentin 300mg  at bedtime which helps.   He lives alone.  He works at [employer]. He does not smoke to drink alcohol.   Out-side paper records, electronic medical record, and images have been reviewed where available and summarized as:  Labs 5/18:  HbA1`c 8.6*, TSH 1.32   Past Medical History:  Diagnosis Date   Acne keloidalis nuchae    Diabetes mellitus    Hypercholesterolemia    Hypertension     Past Surgical History:  Procedure Laterality Date   CYST REMOVAL NECK     HERNIA REPAIR       Medications:  Outpatient Encounter Medications as of 07/17/2020  Medication Sig   aspirin EC 81 MG tablet Take 81 mg by mouth every morning.   gabapentin (NEURONTIN) 100 MG capsule Take 300 mg by mouth daily.   glipiZIDE (GLUCOTROL XL) 10 MG 24 hr tablet Take 10 mg by mouth daily.   JARDIANCE 10 MG TABS tablet Take 10 mg by mouth daily.   omeprazole (PRILOSEC) 40 MG capsule TAKE ONE CAPSULE BY MOUTH TWICE A DAY 30 MINUTES BEFORE MORNING AND  EVENING MEALS   ONE TOUCH ULTRA TEST test strip 1 each by Other route Daily.    pioglitazone (ACTOS) 15 MG tablet Take 15 mg by mouth every morning.   simvastatin (ZOCOR) 40 MG tablet Take 40 mg by mouth every evening.   valsartan-hydrochlorothiazide (DIOVAN-HCT) 80-12.5 MG tablet daily.   bismuth-metronidazole-tetracycline (PYLERA) 140-125-125 MG capsule Take 3 capsules by mouth 4 (four) times daily -  before meals and at bedtime. (Patient not taking: Reported on 07/17/2020)   hydrocortisone (ANUSOL-HC) 25 MG suppository Place 1 suppository (25 mg total) rectally 2 (two) times daily. (Patient not taking: Reported on 07/17/2020)   oxyCODONE-acetaminophen (PERCOCET/ROXICET) 5-325 MG per tablet Take 1-2 tablets by mouth every 4 (four) hours as needed. (Patient not taking: Reported on 07/17/2020)   tetrahydrozoline 0.05 % ophthalmic solution Place 1 drop into both eyes 4 (four) times daily as needed (For eye irritation.).   [DISCONTINUED] cyclobenzaprine (FLEXERIL) 10 MG tablet Take 1 tablet (10 mg total) by mouth 2 (two) times daily as needed for muscle spasms. (Patient not taking: Reported on 07/17/2020)   [DISCONTINUED] glipiZIDE (GLUCOTROL XL) 5 MG 24 hr tablet Take 5 mg by mouth every morning.    [DISCONTINUED] ranitidine (ZANTAC) 150 MG tablet Take 1 tablet (150 mg total) by mouth at bedtime.   No facility-administered encounter medications on file as of 07/17/2020.    Allergies: No Known Allergies  Family  History: Family History  Problem Relation Age of Onset   Diabetes Mother    Breast cancer Mother    Stomach cancer Neg Hx    Colon cancer Neg Hx     Social History: Social History   Tobacco Use   Smoking status: Never   Smokeless tobacco: Never  Vaping Use   Vaping Use: Never used  Substance Use Topics   Alcohol use: No   Drug use: No   Social History   Social History Narrative   Not on file    Vital Signs:  BP 125/64   Pulse 61   Ht 5\' 7"  (1.702 m)   Wt 281 lb 2 oz (127.5  kg)   SpO2 97%   BMI 44.03 kg/m   Neurological Exam: MENTAL STATUS including orientation to time, place, person, recent and remote memory, attention span and concentration, language, and fund of knowledge is normal.  Speech is not dysarthric.  CRANIAL NERVES: II:  No visual field defects.     III-IV-VI: Pupils equal round and reactive to light.  Normal conjugate, extra-ocular eye movements in all directions of gaze.  No nystagmus.  No ptosis.   V:  Normal facial sensation.    VII:  Normal facial symmetry and movements.   VIII:  Normal hearing and vestibular function.   IX-X:  Normal palatal movement.   XI:  Normal shoulder shrug and head rotation.   XII:  Normal tongue strength and range of motion, no deviation or fasciculation.  MOTOR:  No atrophy, fasciculations or abnormal movements.  No pronator drift.   Upper Extremity:  Right  Left  Deltoid  5/5   5/5   Biceps  5/5   5/5   Triceps  5/5   5/5   Infraspinatus 5/5  5/5  Medial pectoralis 5/5  5/5  Wrist extensors  5/5   5/5   Wrist flexors  5/5   5/5   Finger extensors  5/5   5/5   Finger flexors  5/5   5/5   Dorsal interossei  5/5   5/5   Abductor pollicis  5/5   5/5   Tone (Ashworth scale)  0  0   Lower Extremity:  Right  Left  Hip flexors  5/5   5/5   Hip extensors  5/5   5/5   Adductor 5/5  5/5  Abductor 5/5  5/5  Knee flexors  5/5   5/5   Knee extensors  5/5   5/5   Dorsiflexors  5/5   5/5   Plantarflexors  5/5   5/5   Toe extensors  5/5   5/5   Toe flexors  5/5   5/5   Tone (Ashworth scale)  0  0   MSRs:  Right        Left                  brachioradialis 2+  2+  biceps 2+  2+  triceps 2+  2+  patellar 2+  2+  ankle jerk 1+  1+  Hoffman no  no  plantar response down  down   SENSORY:  Normal and symmetric perception of light touch, pinprick, vibration, and proprioception.  Romberg's sign absent.   COORDINATION/GAIT: Normal finger-to- nose-finger and heel-to-shin.  Intact rapid alternating movements  bilaterally.   Gait narrow based and stable. Tandem and stressed gait intact.    IMPRESSION: Bilateral hand hand feet paresthesias.  His exam does not show typical  signs of neuropathy, as sensation is preserved distally, but still need to consider with his history of diabetes.  Need to evaluate for possible entrapment neuropathy in the hands and lumbar radiculopathy in the legs (?S1 radiculopathy). He will return for NCS/EMG of the right arm and leg.  Further recommendations pending results.    Thank you for allowing me to participate in patient's care.  If I can answer any additional questions, I would be pleased to do so.    Sincerely,    Tama Grosz K. Posey Pronto, DO

## 2020-07-17 NOTE — Patient Instructions (Signed)
Nerve testing of the right arm and leg  Do not apply lotion or oil to your skin on the day of testing.   ELECTROMYOGRAM AND NERVE CONDUCTION STUDIES (EMG/NCS) INSTRUCTIONS  How to Prepare The neurologist conducting the EMG will need to know if you have certain medical conditions. Tell the neurologist and other EMG lab personnel if you: Have a pacemaker or any other electrical medical device Take blood-thinning medications Have hemophilia, a blood-clotting disorder that causes prolonged bleeding Bathing Take a shower or bath shortly before your exam in order to remove oils from your skin. Don't apply lotions or creams before the exam.  What to Expect You'll likely be asked to change into a hospital gown for the procedure and lie down on an examination table. The following explanations can help you understand what will happen during the exam.  Electrodes. The neurologist or a technician places surface electrodes at various locations on your skin depending on where you're experiencing symptoms. Or the neurologist may insert needle electrodes at different sites depending on your symptoms.  Sensations. The electrodes will at times transmit a tiny electrical current that you may feel as a twinge or spasm. The needle electrode may cause discomfort or pain that usually ends shortly after the needle is removed. If you are concerned about discomfort or pain, you may want to talk to the neurologist about taking a short break during the exam.  Instructions. During the needle EMG, the neurologist will assess whether there is any spontaneous electrical activity when the muscle is at rest - activity that isn't present in healthy muscle tissue - and the degree of activity when you slightly contract the muscle.  He or she will give you instructions on resting and contracting a muscle at appropriate times. Depending on what muscles and nerves the neurologist is examining, he or she may ask you to change positions  during the exam.  After your EMG You may experience some temporary, minor bruising where the needle electrode was inserted into your muscle. This bruising should fade within several days. If it persists, contact your primary care doctor.

## 2020-08-04 ENCOUNTER — Other Ambulatory Visit: Payer: Self-pay

## 2020-08-04 ENCOUNTER — Ambulatory Visit (INDEPENDENT_AMBULATORY_CARE_PROVIDER_SITE_OTHER): Payer: BC Managed Care – PPO | Admitting: Neurology

## 2020-08-04 DIAGNOSIS — G5601 Carpal tunnel syndrome, right upper limb: Secondary | ICD-10-CM

## 2020-08-04 DIAGNOSIS — E1142 Type 2 diabetes mellitus with diabetic polyneuropathy: Secondary | ICD-10-CM

## 2020-08-04 NOTE — Procedures (Signed)
[facility]  [address]  [place], [place] [address] Tel: [phone] Fax:  [phone] Test Date:  08/04/2020  Patient: Brian Kennedy DOB: 1960-03-09 Physician: Narda Amber, DO  Sex: Male Height: '5\' 7"'$  Ref Phys: Narda Amber, DO  ID#: [id number]   Technician:    Patient Complaints: This is a 60 year old man referred for evaluation of paresthesias of the hands and feet.  NCV & EMG Findings: Extensive electrodiagnostic testing of the right upper and lower extremity shows:  Right mixed palmar sensory response shows prolonged latency.  Right median and ulnar sensory responses are within normal limits. Right sural and superficial peroneal sensory responses show reduced amplitude (R2.0, R2.4 V).  Right median, ulnar, peroneal, and tibial motor responses are within normal limits.  Right tibial H reflex study is within normal limits.  There is no evidence of active or chronic motor axonal loss changes affecting any of the tested muscles.  Motor unit configuration and recruitment pattern is within normal limits.     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 Right median neuropathy at or distal to the wrist (mild), consistent with a clinical diagnosis of carpal tunnel syndrome.     ___________________________ Narda Amber, DO    Nerve Conduction Studies Anti Sensory Summary Table   Stim Site NR Peak (ms) Norm Peak (ms) P-T Amp (V) Norm P-T Amp  Right Median Anti Sensory (2nd Digit)  33C  Wrist    3.6 <3.8 30.1 >10  Right Sup Peroneal Anti Sensory (Ant Lat Mall)  33C  12 cm    2.5 <4.6 2.4 >3  Right Sural Anti Sensory (Lat Mall)  33C  Calf    2.7 <4.6 2.0 >3  Right Ulnar Anti Sensory (5th Digit)  33C  Wrist    2.8 <3.2 26.1 >5   Motor Summary Table   Stim Site NR Onset (ms) Norm Onset (ms) O-P Amp (mV) Norm O-P Amp Site1 Site2 Delta-0 (ms) Dist  (cm) Vel (m/s) Norm Vel (m/s)  Right Median Motor (Abd Poll Brev)  33C  Wrist    3.6 <4.0 8.0 >5 Elbow Wrist 6.0 30.0 50 >50  Elbow    9.6  7.5         Right Peroneal Motor (Ext Dig Brev)  33C  Ankle    3.0 <6.0 4.0 >2.5 B Fib Ankle 8.6 36.0 42 >40  B Fib    11.6  3.8  Poplt B Fib 1.8 10.0 56 >40  Poplt    13.4  3.6         Right Tibial Motor (Abd Hall Brev)  33C  Ankle    4.8 <6.0 8.8 >4 Knee Ankle 8.5 42.0 49 >40  Knee    13.3  5.2         Right Ulnar Motor (Abd Dig Minimi)  33C  Wrist    2.1 <3.1 8.9 >7 B Elbow Wrist 4.5 24.0 53 >50  B Elbow    6.6  8.6  A Elbow B Elbow 1.7 10.0 59 >50  A Elbow    8.3  8.4          Comparison Summary Table   Stim Site NR Peak (ms) Norm Peak (ms) P-T Amp (V) Site1 Site2 Delta-P (ms) Norm Delta (ms)  Right Median/Ulnar Palm Comparison (Wrist - 8cm)  33C  Median Palm    2.3 <2.2 24.6 Median Palm Ulnar Palm 0.7  Ulnar Palm    1.6 <2.2 13.1       H Reflex Studies   NR H-Lat (ms) Lat Norm (ms) L-R H-Lat (ms)  Right Tibial (Gastroc)  33C     31.43 <35    EMG   Side Muscle Ins Act Fibs Psw Fasc Number Recrt Dur Dur. Amp Amp. Poly Poly. Comment  Right Gastroc Nml Nml Nml Nml Nml Nml Nml Nml Nml Nml Nml Nml N/A  Right AntTibialis Nml Nml Nml Nml Nml Nml Nml Nml Nml Nml Nml Nml N/A  Right Flex Dig Long Nml Nml Nml Nml Nml Nml Nml Nml Nml Nml Nml Nml N/A  Right RectFemoris Nml Nml Nml Nml Nml Nml Nml Nml Nml Nml Nml Nml N/A  Right BicepsFemS Nml Nml Nml Nml Nml Nml Nml Nml Nml Nml Nml Nml N/A  Right 1stDorInt Nml Nml Nml Nml Nml Nml Nml Nml Nml Nml Nml Nml N/A  Right Abd Poll Brev Nml Nml Nml Nml Nml Nml Nml Nml Nml Nml Nml Nml N/A  Right PronatorTeres Nml Nml Nml Nml Nml Nml Nml Nml Nml Nml Nml Nml N/A  Right Biceps Nml Nml Nml Nml Nml Nml Nml Nml Nml Nml Nml Nml N/A  Right Triceps Nml Nml Nml Nml Nml Nml Nml Nml Nml Nml Nml Nml N/A  Right Deltoid Nml Nml Nml Nml Nml Nml Nml Nml Nml Nml Nml Nml N/A      Waveforms:

## 2020-08-26 ENCOUNTER — Encounter: Payer: BC Managed Care – PPO | Admitting: Neurology

## 2020-09-03 DIAGNOSIS — E1169 Type 2 diabetes mellitus with other specified complication: Secondary | ICD-10-CM | POA: Diagnosis not present

## 2020-09-03 DIAGNOSIS — G63 Polyneuropathy in diseases classified elsewhere: Secondary | ICD-10-CM | POA: Diagnosis not present

## 2020-09-28 ENCOUNTER — Ambulatory Visit (INDEPENDENT_AMBULATORY_CARE_PROVIDER_SITE_OTHER): Payer: BC Managed Care – PPO | Admitting: Neurology

## 2020-09-28 ENCOUNTER — Encounter: Payer: Self-pay | Admitting: Neurology

## 2020-09-28 ENCOUNTER — Other Ambulatory Visit: Payer: Self-pay

## 2020-09-28 VITALS — BP 132/80 | HR 59 | Ht 67.0 in | Wt 279.0 lb

## 2020-09-28 DIAGNOSIS — G5601 Carpal tunnel syndrome, right upper limb: Secondary | ICD-10-CM

## 2020-09-28 DIAGNOSIS — E1142 Type 2 diabetes mellitus with diabetic polyneuropathy: Secondary | ICD-10-CM | POA: Diagnosis not present

## 2020-09-28 NOTE — Progress Notes (Signed)
Follow-up Visit   Date: 09/28/20   Brian Kennedy MRN: [id number] DOB: 1960/01/28   Interim History: Brian Kennedy is a 60 y.o. right-handed Caucasian male with diabetes mellitus (HbA1c 8.6), hyperlipidemia, and hypertension returning to the clinic for follow-up of neuropathy and carpal tunnel syndrome.  The patient was accompanied to the clinic by self.  History of present illness: Starting around 2020, he began having numbness involving the back of the legs and lateral feet, as well as numbness in the palms of the hands.  He occasioanlly has pain in the legs, which is worse when pressure is applied to his legs. He has weakness with holding objects or opening bottles.  He has some imbalance, no falls.  He takes gabapentin '300mg'$  at bedtime which helps.    He lives alone.  He works at [employer]. He does not smoke to drink alcohol.   UPDATE 09/28/2020:  He is here for follow-up visit.  His EDX testing neuropathy in the feet and right carpal tunnel syndrome.  He reports having sporadic severe sharp pain in the toes. His PCP recently increase his gabapentin to '300mg'$  three times daily which has helped. He also complains of constant throbbing pain in the hands and feet. He admits to being noncompliant with using wrist braces.   Medications:  Current Outpatient Medications on File Prior to Visit  Medication Sig Dispense Refill   aspirin EC 81 MG tablet Take 81 mg by mouth every morning.     gabapentin (NEURONTIN) 300 MG capsule Take 300 mg by mouth 3 (three) times daily.     glipiZIDE (GLUCOTROL XL) 10 MG 24 hr tablet Take 10 mg by mouth daily.     JARDIANCE 10 MG TABS tablet Take 10 mg by mouth daily.     omeprazole (PRILOSEC) 40 MG capsule TAKE ONE CAPSULE BY MOUTH TWICE A DAY 30 MINUTES BEFORE MORNING AND EVENING MEALS     ONE TOUCH ULTRA TEST test strip 1 each by Other route Daily.      pioglitazone (ACTOS) 15 MG tablet Take 15 mg by mouth every morning.     simvastatin  (ZOCOR) 40 MG tablet Take 40 mg by mouth every evening.     tetrahydrozoline 0.05 % ophthalmic solution Place 1 drop into both eyes 4 (four) times daily as needed (For eye irritation.).     valsartan-hydrochlorothiazide (DIOVAN-HCT) 80-12.5 MG tablet daily.     bismuth-metronidazole-tetracycline (PYLERA) 140-125-125 MG capsule Take 3 capsules by mouth 4 (four) times daily -  before meals and at bedtime. (Patient not taking: No sig reported) 120 capsule 0   hydrocortisone (ANUSOL-HC) 25 MG suppository Place 1 suppository (25 mg total) rectally 2 (two) times daily. (Patient not taking: No sig reported) 14 suppository 1   oxyCODONE-acetaminophen (PERCOCET/ROXICET) 5-325 MG per tablet Take 1-2 tablets by mouth every 4 (four) hours as needed. (Patient not taking: No sig reported) 15 tablet 0   No current facility-administered medications on file prior to visit.    Allergies: No Known Allergies  Vital Signs:  BP 132/80   Pulse (!) 59   Ht '5\' 7"'$  (1.702 m)   Wt 279 lb (126.6 kg)   SpO2 98%   BMI 43.70 kg/m   Neurological Exam: MENTAL STATUS including orientation to time, place, person, recent and remote memory, attention span and concentration, language, and fund of knowledge is normal.  Speech is not dysarthric.  CRANIAL NERVES:   Pupils equal round and reactive to light.  Normal conjugate, extra-ocular eye movements in all directions of gaze.  No ptosis.    MOTOR:  Motor strength is 5/5 in all extremities.  No atrophy, fasciculations or abnormal movements.  No pronator drift.  Tone is normal.    MSRs:  Reflexes are 2+/4 throughout, except reduced at the ankles.  SENSORY:  Vibration is reduced in the feet.    COORDINATION/GAIT:   Gait mildly-wide based and stable, unassisted, stable.   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 Right median neuropathy  at or distal to the wrist (mild), consistent with a clinical diagnosis of carpal tunnel syndrome.    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  - Stressed importance of tight diabetes manamgenemt  Right carpal tunnel syndrome (mild)  - Encouraged compliance with using wrist splints  - If symptoms get worse, he may consider occupational therapy.  Therapy declined today.   3.  Musculoskeletal pain involving the back, hips, feet  - Follow-up with PCP  Return to clinic as needed   Thank you for allowing me to participate in patient's care.  If I can answer any additional questions, I would be pleased to do so.    Sincerely,    Shenell Rogalski K. Posey Pronto, DO

## 2020-09-28 NOTE — Patient Instructions (Signed)
Please wear your wrist braces nightly  If you want to start therapy to strengthen your hands, please contact my office or discuss with your primary care doctory

## 2020-12-14 DIAGNOSIS — E1169 Type 2 diabetes mellitus with other specified complication: Secondary | ICD-10-CM | POA: Diagnosis not present

## 2020-12-14 DIAGNOSIS — N481 Balanitis: Secondary | ICD-10-CM | POA: Diagnosis not present

## 2020-12-14 DIAGNOSIS — R300 Dysuria: Secondary | ICD-10-CM | POA: Diagnosis not present

## 2021-02-09 DIAGNOSIS — M722 Plantar fascial fibromatosis: Secondary | ICD-10-CM | POA: Diagnosis not present

## 2021-03-26 ENCOUNTER — Other Ambulatory Visit: Payer: Self-pay

## 2021-03-26 ENCOUNTER — Ambulatory Visit (INDEPENDENT_AMBULATORY_CARE_PROVIDER_SITE_OTHER): Payer: BC Managed Care – PPO | Admitting: Neurology

## 2021-03-26 ENCOUNTER — Encounter: Payer: Self-pay | Admitting: Neurology

## 2021-03-26 VITALS — BP 168/85 | HR 82 | Ht 67.0 in | Wt 285.0 lb

## 2021-03-26 DIAGNOSIS — E1142 Type 2 diabetes mellitus with diabetic polyneuropathy: Secondary | ICD-10-CM | POA: Diagnosis not present

## 2021-03-26 NOTE — Progress Notes (Signed)
? ? ?Follow-up Visit ? ? ?Date: 03/26/21 ? ? ?[place] ?MRN: [id number] ?DOB: 06-29-1960 ? ? ?Interim History: ?Brian Kennedy is a 61 y.o. right-handed Caucasian male with diabetes mellitus (HbA1c 8.6), hyperlipidemia, and hypertension returning to the clinic for follow-up of neuropathy and carpal tunnel syndrome.  The patient was accompanied to the clinic by self. ? ?History of present illness: ?Starting around 2020, he began having numbness involving the back of the legs and lateral feet, as well as numbness in the palms of the hands.  He occasioanlly has pain in the legs, which is worse when pressure is applied to his legs. He has weakness with holding objects or opening bottles.  He has some imbalance, no falls.  He takes gabapentin '300mg'$  at bedtime which helps.  ?  ?He lives alone.  He works at [employer]. He does not smoke to drink alcohol.  ? ?UPDATE 09/28/2020:   His EDX testing neuropathy in the feet and right carpal tunnel syndrome.  He reports having sporadic severe sharp pain in the toes. His PCP recently increase his gabapentin to '300mg'$  three times daily which has helped. He also complains of constant throbbing pain in the hands and feet. He admits to being noncompliant with using wrist braces.  ? ?UPDATE 03/26/2021:  Patient unintentionally scheduled a follow-up, thinking he was making endocrinology appointment.  From a neurological standpoint, his neuropathy is stable.  He continues to have numbness and episodic pain in the feet.  He takes gabapentin '300mg'$  TID which helps.  No new complaints.  ? ?Medications:  ?Current Outpatient Medications on File Prior to Visit  ?Medication Sig Dispense Refill  ? aspirin EC 81 MG tablet Take 81 mg by mouth every morning.    ? gabapentin (NEURONTIN) 300 MG capsule Take 300 mg by mouth 3 (three) times daily.    ? glipiZIDE (GLUCOTROL XL) 10 MG 24 hr tablet Take 10 mg by mouth daily.    ? JARDIANCE 10 MG TABS tablet Take 10 mg by mouth daily.    ?  omeprazole (PRILOSEC) 40 MG capsule TAKE ONE CAPSULE BY MOUTH TWICE A DAY 30 MINUTES BEFORE MORNING AND EVENING MEALS    ? ONE TOUCH ULTRA TEST test strip 1 each by Other route Daily.     ? pioglitazone (ACTOS) 15 MG tablet Take 15 mg by mouth every morning.    ? tetrahydrozoline 0.05 % ophthalmic solution Place 1 drop into both eyes 4 (four) times daily as needed (For eye irritation.).    ? bismuth-metronidazole-tetracycline (PYLERA) 140-125-125 MG capsule Take 3 capsules by mouth 4 (four) times daily -  before meals and at bedtime. (Patient not taking: Reported on 07/17/2020) 120 capsule 0  ? hydrocortisone (ANUSOL-HC) 25 MG suppository Place 1 suppository (25 mg total) rectally 2 (two) times daily. (Patient not taking: Reported on 07/17/2020) 14 suppository 1  ? oxyCODONE-acetaminophen (PERCOCET/ROXICET) 5-325 MG per tablet Take 1-2 tablets by mouth every 4 (four) hours as needed. (Patient not taking: Reported on 07/17/2020) 15 tablet 0  ? simvastatin (ZOCOR) 40 MG tablet Take 40 mg by mouth every evening. (Patient not taking: Reported on 03/26/2021)    ? valsartan-hydrochlorothiazide (DIOVAN-HCT) 80-12.5 MG tablet daily. (Patient not taking: Reported on 03/26/2021)    ? ?No current facility-administered medications on file prior to visit.  ? ? ?Allergies: No Known Allergies ? ?Vital Signs:  ?BP (!) 168/85   Pulse 82   Ht '5\' 7"'$  (1.702 m)   Wt 285 lb (129.3  kg)   SpO2 98%   BMI 44.64 kg/m?  ? ?Neurological Exam: ?MENTAL STATUS including orientation to time, place, person, recent and remote memory, attention span and concentration, language, and fund of knowledge is normal.  Speech is not dysarthric. ? ?CRANIAL NERVES:   Pupils equal round and reactive to light.  Normal conjugate, extra-ocular eye movements in all directions of gaze.  No ptosis.   ? ?MOTOR:  Motor strength is 5/5 in all extremities.  No atrophy, fasciculations or abnormal movements.  No pronator drift.  Tone is normal.   ? ?MSRs:  Reflexes are 2+/4  throughout, except reduced at the ankles. ? ?SENSORY:  Vibration is reduced in the feet.   ? ?COORDINATION/GAIT:   Gait mildly-wide based and stable, unassisted, stable.  ? 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 ?Right median neuropathy at or distal to the wrist (mild), consistent with a clinical diagnosis of carpal tunnel syndrome.   ? ?IMPRESSION/PLAN: ?Diabetic neuropathy affecting the feet and lower legs, mild ? - Continue gabapentin '300mg'$  three times daily, prescribed by PCP ? - Patient educated on daily foot inspection, fall prevention, and safety precautions around the home. ? - Stressed importance of tight diabetes management.  He is schedule to see endocrinology ? ?Right carpal tunnel syndrome, mild ? - Encouraged compliance with using wrist splints ? ?Return to clinic as needed ? ? ?Thank you for allowing me to participate in patient's care.  If I can answer any additional questions, I would be pleased to do so.   ? ?Sincerely, ? ? ? ?Scottie Metayer K. Posey Pronto, DO ? ? ?

## 2021-04-22 DIAGNOSIS — E118 Type 2 diabetes mellitus with unspecified complications: Secondary | ICD-10-CM | POA: Diagnosis not present

## 2021-04-22 DIAGNOSIS — I10 Essential (primary) hypertension: Secondary | ICD-10-CM | POA: Diagnosis not present

## 2021-04-22 DIAGNOSIS — N481 Balanitis: Secondary | ICD-10-CM | POA: Diagnosis not present

## 2021-04-22 DIAGNOSIS — R350 Frequency of micturition: Secondary | ICD-10-CM | POA: Diagnosis not present

## 2021-04-22 DIAGNOSIS — T7840XA Allergy, unspecified, initial encounter: Secondary | ICD-10-CM | POA: Diagnosis not present

## 2021-06-03 ENCOUNTER — Emergency Department (HOSPITAL_COMMUNITY)
Admission: EM | Admit: 2021-06-03 | Discharge: 2021-06-03 | Disposition: A | Discharge status: Home / Self Care | Payer: BC Managed Care – PPO | Attending: Emergency Medicine | Admitting: Emergency Medicine

## 2021-06-03 ENCOUNTER — Encounter (HOSPITAL_COMMUNITY): Payer: Self-pay | Admitting: Emergency Medicine

## 2021-06-03 DIAGNOSIS — E1140 Type 2 diabetes mellitus with diabetic neuropathy, unspecified: Secondary | ICD-10-CM | POA: Diagnosis not present

## 2021-06-03 DIAGNOSIS — I10 Essential (primary) hypertension: Secondary | ICD-10-CM | POA: Diagnosis not present

## 2021-06-03 DIAGNOSIS — E1142 Type 2 diabetes mellitus with diabetic polyneuropathy: Secondary | ICD-10-CM | POA: Diagnosis not present

## 2021-06-03 DIAGNOSIS — Z7984 Long term (current) use of oral hypoglycemic drugs: Secondary | ICD-10-CM | POA: Insufficient documentation

## 2021-06-03 DIAGNOSIS — G629 Polyneuropathy, unspecified: Secondary | ICD-10-CM | POA: Diagnosis not present

## 2021-06-03 DIAGNOSIS — Z7982 Long term (current) use of aspirin: Secondary | ICD-10-CM | POA: Diagnosis not present

## 2021-06-03 DIAGNOSIS — M79671 Pain in right foot: Secondary | ICD-10-CM | POA: Diagnosis not present

## 2021-06-03 MED ORDER — DULOXETINE HCL 20 MG PO CPEP: 20.0000 mg | ORAL_CAPSULE | Freq: Every day | ORAL | 1 refills | Status: AC

## 2021-06-03 NOTE — ED Triage Notes (Signed)
Patient c/o right foot pain and swelling x 3-4 years.  Patient states "it feels like I stepped on an ice cube."

## 2021-06-03 NOTE — Discharge Instructions (Signed)
As we discussed, your work-up today was reassuring for acute abnormalities.  I suspect that your symptoms were related to your diabetic neuropathy.  I have given you a prescription for medication to help manage this.  Please fill this medication and take as prescribed daily with a meal.  Follow-up with your primary care doctor in the next few days for further evaluation and management of your symptoms.  Return if development of any new or worsening symptoms.

## 2021-06-03 NOTE — ED Provider Notes (Signed)
[facility] EMERGENCY DEPARTMENT Provider Note   CSN: [id number] Arrival date & time: 06/03/21  8299     History  Chief Complaint  Patient presents with   Foot Pain    Brian Kennedy is a 61 y.o. male.  Patient with history of diabetes, hypertension, hypercholesterolemia presents today with complaints of right foot pain. He states that same has been ongoing for the past 3-4 years. He states that he is followed by [facility] for management of same and has been diagnosed with diabetic neuropathy and is on Gabapentin 300 mg TID but does not feel as though this is helping or that neurology is doing anything to help his pain. He states that over the past several days he has intermittently felt a sensation that he is 'stepping on an ice cube' and there is nothing there. He is concerned that his neuropathy is getting worse. He also states that he normally takes [facility]: [facility] Rhonda W powders every few hours as this is the only thing he can get to relieve his symptoms. He states that his blood sugars have been well controlled recently. Denies any back pain, loss of bowel or bladder function, or saddle paraesthesias. Also denies any weakness in his legs.  The history is provided by the patient. No language interpreter was used.  Foot Pain      Home Medications Prior to Admission medications   Medication Sig Start Date End Date Taking? Authorizing Provider  aspirin EC 81 MG tablet Take 81 mg by mouth every morning.    [provider]  bismuth-metronidazole-tetracycline [facility]) [id number] MG capsule Take 3 capsules by mouth 4 (four) times daily -  before meals and at bedtime. Patient not taking: Reported on 07/17/2020 12/23/16   Mauri Pole, MD  gabapentin (NEURONTIN) 300 MG capsule Take 300 mg by mouth 3 (three) times daily. 06/22/20   [provider]  glipiZIDE (GLUCOTROL XL) 10 MG 24 hr tablet Take 10 mg by mouth daily. 05/29/20   [provider]   hydrocortisone (ANUSOL-HC) 25 MG suppository Place 1 suppository (25 mg total) rectally 2 (two) times daily. Patient not taking: Reported on 07/17/2020 02/27/17   Levin Erp, PA  JARDIANCE 10 MG TABS tablet Take 10 mg by mouth daily. 05/29/20   [provider]  omeprazole (PRILOSEC) 40 MG capsule TAKE ONE CAPSULE BY MOUTH TWICE A DAY 30 MINUTES BEFORE MORNING AND EVENING MEALS 12/05/16   [provider]  ONE TOUCH ULTRA TEST test strip 1 each by Other route Daily.  09/22/10   [provider]  oxyCODONE-acetaminophen (PERCOCET/ROXICET) 5-325 MG per tablet Take 1-2 tablets by mouth every 4 (four) hours as needed. Patient not taking: Reported on 07/17/2020 05/21/13   Charlann Lange, PA-C  pioglitazone (ACTOS) 15 MG tablet Take 15 mg by mouth every morning.    [provider]  simvastatin (ZOCOR) 40 MG tablet Take 40 mg by mouth every evening. Patient not taking: Reported on 03/26/2021    [provider]  tetrahydrozoline 0.05 % ophthalmic solution Place 1 drop into both eyes 4 (four) times daily as needed (For eye irritation.).    [provider]  valsartan-hydrochlorothiazide (DIOVAN-HCT) 80-12.5 MG tablet daily. Patient not taking: Reported on 03/26/2021 01/20/17   [provider]      Allergies    Patient has no known allergies.    Review of Systems   Review of Systems  Musculoskeletal:  Positive for arthralgias and myalgias.  All other systems reviewed  and are negative.  Physical Exam Updated Vital Signs BP (!) 167/79   Pulse 62   Temp 97.9 F (36.6 C) (Oral)   Resp 17   Ht '5\' 7"'$  (1.702 m)   Wt 127 kg   SpO2 97%   BMI 43.85 kg/m  Physical Exam Vitals and nursing note reviewed.  Constitutional:      General: He is not in acute distress.    Appearance: Normal appearance. He is normal weight. He is not ill-appearing, toxic-appearing or diaphoretic.  HENT:     Head: Normocephalic and atraumatic.  Cardiovascular:      Rate and Rhythm: Normal rate.  Pulmonary:     Effort: Pulmonary effort is normal. No respiratory distress.  Musculoskeletal:        General: Normal range of motion.     Cervical back: Normal range of motion.     Comments: DP and PT pulses intact and 2+ bilaterally. 5/5 strength intact to bilateral lower extremities. No wounds visualized to the feet. Capillary refill less than 2 seconds. Normal gait. No posterior calf tenderness or unilateral swelling. No erythema, warmth, or palpable cord.  No cervical, thoracic, or lumbar spinal tenderness.  Skin:    General: Skin is warm and dry.  Neurological:     General: No focal deficit present.     Mental Status: He is alert.  Psychiatric:        Mood and Affect: Mood normal.        Behavior: Behavior normal.    ED Results / Procedures / Treatments   Labs (all labs ordered are listed, but only abnormal results are displayed) Labs Reviewed - No data to display  EKG None  Radiology No results found.  Procedures Procedures    Medications Ordered in ED Medications - No data to display  ED Course/ Medical Decision Making/ A&P                           Medical Decision Making  Patient presents today with 4 years of foot pain.  He is afebrile, nontoxic-appearing, and in no acute distress reassuring vital signs. Hx of diabetic neuropathy, followed by neurology for same. I have reviewed their outpatient notes and confirmed that they have formally diagnosed him with diabetic neuropathy and prescribed him Gabapentin for same which he has been taking. He states that his blood sugars have been well controlled. Upon physical exam, his bilateral feet are normal-appearing without erythema, warmth, or tenderness.  Strength intact with normal gait and no weakness.  No wounds visualized to the patient's foot.  No unilateral swelling or palpable cord.  Distal pulses intact and 2+.  Given this, low suspicion for DVT, septic joint, injury, or any  other emergent concerns.  Patient's symptoms are extremely likely to be attributed to his neuropathy.  He is already on the max dose daily gabapentin.  Will add Cymbalta for further management of his symptoms.  I have also educated the patient on the dangers of taking too many BC powders daily.  He is otherwise stable for discharge at this time, educated on red flag symptoms of prompt immediate return.  Also educated on the importance of following up with primary care doctor for further evaluation and management of his symptoms as well as to evaluate the effectiveness of the Cymbalta.  He is understanding and amenable with plan, discharged in stable condition.   Final Clinical Impression(s) / ED Diagnoses Final diagnoses:  Diabetic  polyneuropathy associated with type 2 diabetes mellitus ([place])    Rx / DC Orders ED Discharge Orders          Ordered    DULoxetine (CYMBALTA) 20 MG capsule  Daily        06/03/21 0724          An After Visit Summary was printed and given to the patient.     Nestor Lewandowsky 06/03/21 0511    Dorie Rank, MD 06/03/21 1046

## 2021-07-06 DIAGNOSIS — G6281 Critical illness polyneuropathy: Secondary | ICD-10-CM | POA: Diagnosis not present

## 2021-07-06 DIAGNOSIS — Z0000 Encounter for general adult medical examination without abnormal findings: Secondary | ICD-10-CM | POA: Diagnosis not present

## 2021-07-06 DIAGNOSIS — I10 Essential (primary) hypertension: Secondary | ICD-10-CM | POA: Diagnosis not present

## 2021-07-06 DIAGNOSIS — E7800 Pure hypercholesterolemia, unspecified: Secondary | ICD-10-CM | POA: Diagnosis not present

## 2021-07-06 DIAGNOSIS — Z79899 Other long term (current) drug therapy: Secondary | ICD-10-CM | POA: Diagnosis not present

## 2021-07-06 DIAGNOSIS — E1165 Type 2 diabetes mellitus with hyperglycemia: Secondary | ICD-10-CM | POA: Diagnosis not present

## 2021-07-06 DIAGNOSIS — Z125 Encounter for screening for malignant neoplasm of prostate: Secondary | ICD-10-CM | POA: Diagnosis not present

## 2021-07-06 DIAGNOSIS — E785 Hyperlipidemia, unspecified: Secondary | ICD-10-CM | POA: Diagnosis not present

## 2021-07-19 ENCOUNTER — Ambulatory Visit (INDEPENDENT_AMBULATORY_CARE_PROVIDER_SITE_OTHER): Payer: BC Managed Care – PPO | Admitting: Internal Medicine

## 2021-07-19 ENCOUNTER — Encounter: Payer: Self-pay | Admitting: Internal Medicine

## 2021-07-19 VITALS — BP 116/72 | HR 63 | Ht 67.0 in | Wt 273.0 lb

## 2021-07-19 DIAGNOSIS — E1142 Type 2 diabetes mellitus with diabetic polyneuropathy: Secondary | ICD-10-CM | POA: Diagnosis not present

## 2021-07-19 DIAGNOSIS — E785 Hyperlipidemia, unspecified: Secondary | ICD-10-CM | POA: Diagnosis not present

## 2021-07-19 DIAGNOSIS — E1165 Type 2 diabetes mellitus with hyperglycemia: Secondary | ICD-10-CM | POA: Diagnosis not present

## 2021-07-19 LAB — POCT GLUCOSE (DEVICE FOR HOME USE): Glucose Fasting, POC: 173 mg/dL — AB (ref 70–99)

## 2021-07-19 MED ORDER — PIOGLITAZONE HCL 30 MG PO TABS: 30.0000 mg | ORAL_TABLET | Freq: Every day | ORAL | 2 refills | Status: DC

## 2021-07-19 MED ORDER — GLIPIZIDE 10 MG PO TABS: 10.0000 mg | ORAL_TABLET | Freq: Two times a day (BID) | ORAL | 2 refills | Status: DC

## 2021-07-19 MED ORDER — EMPAGLIFLOZIN 25 MG PO TABS: 25.0000 mg | ORAL_TABLET | Freq: Every day | ORAL | 2 refills | Status: DC

## 2021-07-19 NOTE — Patient Instructions (Signed)
Change Glipizide 10 mg , 1 tablet before Breakfast and 1 tablet before supper  INcrease Pioglitazone 30 mg , 1 tablet daily  Continue Jardiance 25 mg daily     HOW TO TREAT LOW BLOOD SUGARS (Blood sugar LESS THAN 70 MG/DL) Please follow the RULE OF 15 for the treatment of hypoglycemia treatment (when your (blood sugars are less than 70 mg/dL)   STEP 1: Take 15 grams of carbohydrates when your blood sugar is low, which includes:  3-4 GLUCOSE TABS  OR 3-4 OZ OF JUICE OR REGULAR SODA OR ONE TUBE OF GLUCOSE GEL    STEP 2: RECHECK blood sugar in 15 MINUTES STEP 3: If your blood sugar is still low at the 15 minute recheck --> then, go back to STEP 1 and treat AGAIN with another 15 grams of carbohydrates.

## 2021-07-19 NOTE — Progress Notes (Signed)
Name: Brian Kennedy  MRN/ DOB: [id number], Oct 06, 1960   Age/ Sex: 61 y.o., male    PCP: Lujean Amel, MD   Reason for Endocrinology Evaluation: Type 2 Diabetes Mellitus     Date of Initial Endocrinology Visit: 07/19/2021     PATIENT IDENTIFIER: Brian Kennedy is a 61 y.o. male with a past medical history of T2DM and dyslipidemia . The patient presented for initial endocrinology clinic visit on 07/19/2021 for consultative assistance with his diabetes management.    HPI: Brian Kennedy was    Diagnosed with DM >20 yrs  Prior Medications tried/Intolerance: Metformin caused nausea  Currently checking blood sugars 2-3 x / day Hypoglycemia episodes : no                 Hemoglobin A1c has ranged from 8.0% in 2023, peaking at 9.8% in 2021. Patient required assistance for hypoglycemia: no Patient has required hospitalization within the last 1 year from hyper or hypoglycemia: no  In terms of diet, the patient eats 2-3 meals a day and snacks once, avoids sugar sweetened beverages    He has chronic feet pain , attributes to neuropathy . ON Cymbalta  and Gabapentin    Denies nausea, vomiting or diarrhea   HOME DIABETES REGIMEN: Glipizide ER 10 mg Pioglitazone 10 mg daily  Jardiance 25 mg daily    Statin: yes ACE-I/ARB: yes    METER DOWNLOAD SUMMARY: Did not bring    DIABETIC COMPLICATIONS: Microvascular complications:  neuropathy Denies: CKD Last eye exam: Completed 2 yes ago   Macrovascular complications:   Denies: CAD, PVD, CVA   PAST HISTORY: Past Medical History:  Past Medical History:  Diagnosis Date   Acne keloidalis nuchae    Diabetes mellitus    Hypercholesterolemia    Hypertension    Past Surgical History:  Past Surgical History:  Procedure Laterality Date   CYST REMOVAL NECK     HERNIA REPAIR      Social History:  reports that he has never smoked. He has never used smokeless tobacco. He reports that he does not drink alcohol and does not  use drugs. Family History:  Family History  Problem Relation Age of Onset   Diabetes Mother    Breast cancer Mother    Stomach cancer Neg Hx    Colon cancer Neg Hx      HOME MEDICATIONS: Allergies as of 07/19/2021   No Known Allergies      Medication List        Accurate as of July 19, 2021  8:30 AM. If you have any questions, ask your nurse or doctor.          STOP taking these medications    glipiZIDE 10 MG 24 hr tablet Commonly known as: GLUCOTROL XL Replaced by: glipiZIDE 10 MG tablet Stopped by: Dorita Sciara, MD       TAKE these medications    aspirin EC 81 MG tablet Take 81 mg by mouth every morning.   bismuth-metronidazole-tetracycline 140-125-125 MG capsule Commonly known as: Pylera Take 3 capsules by mouth 4 (four) times daily -  before meals and at bedtime.   DULoxetine 20 MG capsule Commonly known as: Cymbalta Take 1 capsule (20 mg total) by mouth daily.   empagliflozin 25 MG Tabs tablet Commonly known as: Jardiance Take 1 tablet (25 mg total) by mouth daily before breakfast. What changed:  medication strength when to take this Changed by: Dorita Sciara, MD   gabapentin 300  MG capsule Commonly known as: NEURONTIN Take 300 mg by mouth 3 (three) times daily.   glipiZIDE 10 MG tablet Commonly known as: Glucotrol Take 1 tablet (10 mg total) by mouth 2 (two) times daily before a meal. Replaces: glipiZIDE 10 MG 24 hr tablet Started by: Dorita Sciara, MD   hydrocortisone 25 MG suppository Commonly known as: ANUSOL-HC Place 1 suppository (25 mg total) rectally 2 (two) times daily.   omeprazole 40 MG capsule Commonly known as: PRILOSEC TAKE ONE CAPSULE BY MOUTH TWICE A DAY 30 MINUTES BEFORE MORNING AND EVENING MEALS   ONE TOUCH ULTRA TEST test strip Generic drug: glucose blood 1 each by Other route Daily.   oxyCODONE-acetaminophen 5-325 MG tablet Commonly known as: PERCOCET/ROXICET Take 1-2 tablets by mouth every  4 (four) hours as needed.   pioglitazone 30 MG tablet Commonly known as: Actos Take 1 tablet (30 mg total) by mouth daily. What changed:  medication strength how much to take when to take this Changed by: Dorita Sciara, MD   simvastatin 40 MG tablet Commonly known as: ZOCOR Take 40 mg by mouth every evening.   tetrahydrozoline 0.05 % ophthalmic solution Place 1 drop into both eyes 4 (four) times daily as needed (For eye irritation.).   valsartan-hydrochlorothiazide 80-12.5 MG tablet Commonly known as: DIOVAN-HCT daily.         ALLERGIES: No Known Allergies   REVIEW OF SYSTEMS: A comprehensive ROS was conducted with the patient and is negative except as per HPI    OBJECTIVE:   VITAL SIGNS: BP 116/72 (BP Location: Right Arm, Patient Position: Sitting, Cuff Size: Large)   Pulse 63   Ht '5\' 7"'$  (1.702 m)   Wt 273 lb (123.8 kg)   SpO2 97%   BMI 42.76 kg/m    PHYSICAL EXAM:  General: Pt appears well and is in NAD  Hydration: Well-hydrated with moist mucous membranes and good skin turgor  HEENT: Head: Unremarkable with good dentition. Oropharynx clear without exudate.  Eyes: External eye exam normal without stare, lid lag or exophthalmos.  EOM intact.  PERRL.  Neck: General: Supple without adenopathy or carotid bruits. Thyroid: Thyroid size normal.  No goiter or nodules appreciated. No thyroid bruit.  Lungs: Clear with good BS bilat with no rales, rhonchi, or wheezes  Heart: RRR with normal S1 and S2 and no gallops; no murmurs; no rub  Abdomen: Normoactive bowel sounds, soft, nontender, without masses or organomegaly palpable  Extremities:  Lower extremities - No pretibial edema. No lesions.  Skin: Normal texture and temperature to palpation. No rash noted. No Acanthosis nigricans/skin tags. No lipohypertrophy.  Neuro: MS is good with appropriate affect, pt is alert and Ox3    DM foot exam: 07/19/2021  The skin of the feet is intact without sores or  ulcerations. The pedal pulses are 2+ on right and 2+ on left. The sensation is intact to a screening 5.07, 10 gram monofilament bilaterally    DATA REVIEWED:  07/06/2021 A1c 8.8%  HDL 39 LDL93 MA 0.7  TSH 4.010 uIU/mL     ASSESSMENT / PLAN / RECOMMENDATIONS:   1) Type 2 Diabetes Mellitus,Poorly  controlled, With neuropathic complications - Most recent A1c of 8.8 %. Goal A1c < 7.0 %.    Plan: GENERAL: I have discussed with the patient the pathophysiology of diabetes. We went over the natural progression of the disease. We talked about both insulin resistance and insulin deficiency. I explained the complications associated with diabetes including retinopathy, nephropathy,  neuropathy as well as increased risk of cardiovascular disease. We went over the benefit seen with glycemic control.   I explained to the patient that diabetic patients are at higher than normal risk for amputations.  Will switch Glipizide from ER to regular formulation to reduce risk of hypoglycemia  Pt advised to bring glucose meter on next visit   MEDICATIONS: Change Glipizide 10 mg , 1 tablet before Breakfast and 1 tablet before supper  INcrease Pioglitazone 30 mg , 1 tablet daily  Continue Jardiance 25 mg daily   EDUCATION / INSTRUCTIONS: BG monitoring instructions: Patient is instructed to check his blood sugars 1 times a day, fasting . Call [facility] if: BG persistently < 70 I reviewed the Rule of 15 for the treatment of hypoglycemia in detail with the patient. Literature supplied.   2) Diabetic complications:  Eye: Does not have known diabetic retinopathy.  Neuro/ Feet: Does  have known diabetic peripheral neuropathy. Renal: Patient does not have known baseline CKD. He is  on an ACEI/ARB at present.  3) Dyslipidemia :   LDL acceptable but not at goal at 93 mg/dL. Will continue to monitor and change simvastatin 40 mg if necessary if no improvement    4) Peripheral  Neuropathy:   -  Pt follows with neurology  - Cymbalta has improved his symptoms  - I did explain to him  that topimizing glucose control will slow down future nerve damage but not necessarily will resolve his pain  - I also did explain to him that pain control due to neuropathy is beyond my scope and he may have to see a pain clinic if neurology is unable to control his symptoms       Signed electronically by: Mack Guise, MD  [facility]  [facility] [address] [place], [place] [address] Phone: [phone] FAX: [phone]   CC: Lujean Amel, MD [address] [address] Phone: [phone]  Fax: [phone]    Return to Endocrinology clinic as below: No future appointments.

## 2021-08-12 DIAGNOSIS — M545 Low back pain, unspecified: Secondary | ICD-10-CM | POA: Diagnosis not present

## 2021-12-06 ENCOUNTER — Encounter: Payer: Self-pay | Admitting: Internal Medicine

## 2021-12-06 ENCOUNTER — Ambulatory Visit (INDEPENDENT_AMBULATORY_CARE_PROVIDER_SITE_OTHER): Payer: BC Managed Care – PPO | Admitting: Internal Medicine

## 2021-12-06 VITALS — BP 132/80 | HR 94 | Ht 67.0 in | Wt 284.0 lb

## 2021-12-06 DIAGNOSIS — E1142 Type 2 diabetes mellitus with diabetic polyneuropathy: Secondary | ICD-10-CM

## 2021-12-06 DIAGNOSIS — E1165 Type 2 diabetes mellitus with hyperglycemia: Secondary | ICD-10-CM | POA: Diagnosis not present

## 2021-12-06 LAB — POCT GLYCOSYLATED HEMOGLOBIN (HGB A1C): Hemoglobin A1C: 7.4 % — AB (ref 4.0–5.6)

## 2021-12-06 MED ORDER — PIOGLITAZONE HCL 30 MG PO TABS: 30.0000 mg | ORAL_TABLET | Freq: Every day | ORAL | 3 refills | Status: DC

## 2021-12-06 MED ORDER — EMPAGLIFLOZIN 25 MG PO TABS: 25.0000 mg | ORAL_TABLET | Freq: Every day | ORAL | 3 refills | Status: DC

## 2021-12-06 MED ORDER — GLIPIZIDE 10 MG PO TABS: 10.0000 mg | ORAL_TABLET | Freq: Two times a day (BID) | ORAL | 3 refills | Status: DC

## 2021-12-06 NOTE — Patient Instructions (Signed)
Continue Glipizide 10 mg , 1 tablet before Breakfast and 1 tablet before supper  Continue Pioglitazone 30 mg , 1 tablet daily  Continue Jardiance 25 mg daily     HOW TO TREAT LOW BLOOD SUGARS (Blood sugar LESS THAN 70 MG/DL) Please follow the RULE OF 15 for the treatment of hypoglycemia treatment (when your (blood sugars are less than 70 mg/dL)   STEP 1: Take 15 grams of carbohydrates when your blood sugar is low, which includes:  3-4 GLUCOSE TABS  OR 3-4 OZ OF JUICE OR REGULAR SODA OR ONE TUBE OF GLUCOSE GEL    STEP 2: RECHECK blood sugar in 15 MINUTES STEP 3: If your blood sugar is still low at the 15 minute recheck --> then, go back to STEP 1 and treat AGAIN with another 15 grams of carbohydrates.

## 2021-12-06 NOTE — Progress Notes (Signed)
Name: Brian Kennedy  MRN/ DOB: [id number], 1960/03/31   Age/ Sex: 61 y.o., male    PCP: Lujean Amel, MD   Reason for Endocrinology Evaluation: Type 2 Diabetes Mellitus     Date of Initial Endocrinology Visit: 07/19/2021    PATIENT IDENTIFIER: Brian Kennedy is a 61 y.o. male with a past medical history of T2DM and dyslipidemia . The patient presented for initial endocrinology clinic visit on 07/19/2021  for consultative assistance with his diabetes management.    HPI: Mr. Solly was    Diagnosed with DM >20 yrs  Prior Medications tried/Intolerance: Metformin caused nausea                Hemoglobin A1c has ranged from 8.0% in 2023, peaking at 9.8% in 2021.   He has chronic feet pain , attributes to neuropathy . ON Cymbalta  and Gabapentin   On his initial visit to our clinic he had an A1c of 8.8%, patient was on Jardiance, pioglitazone, and glipizide.  We increased glipizide and pioglitazone and continue Jardiance SUBJECTIVE:   During the last visit (07/19/2021): A1c 8.8%      Today (12/06/21): Mr. Erdahl is here for a follow up on diabetes management. He checks his blood sugars 1 times daily. The patient has not had hypoglycemic episodes since the last clinic visit  Denies nausea , vomiting or diarrhea has noted swelling around the ankles   He has not followed with  neurology for peripheral neuropathy in a year, he continues to be symptomatic and noted involuntary arm movement yesterday    HOME DIABETES REGIMEN: Glipizide 10 mg twice daily Pioglitazone 30 mg daily  Jardiance 25 mg daily    Statin: yes ACE-I/ARB: yes    METER DOWNLOAD SUMMARY:  95-172 mg/dL   DIABETIC COMPLICATIONS: Microvascular complications:  Neuropathy-follows with neurology Denies: CKD Last eye exam: Completed 2 yrs ago   Macrovascular complications:   Denies: CAD, PVD, CVA   PAST HISTORY: Past Medical History:  Past Medical History:  Diagnosis Date   Acne  keloidalis nuchae    Diabetes mellitus    Hypercholesterolemia    Hypertension    Past Surgical History:  Past Surgical History:  Procedure Laterality Date   CYST REMOVAL NECK     HERNIA REPAIR      Social History:  reports that he has never smoked. He has never used smokeless tobacco. He reports that he does not drink alcohol and does not use drugs. Family History:  Family History  Problem Relation Age of Onset   Diabetes Mother    Breast cancer Mother    Stomach cancer Neg Hx    Colon cancer Neg Hx      HOME MEDICATIONS: Allergies as of 12/06/2021   No Known Allergies      Medication List        Accurate as of December 06, 2021  8:24 AM. If you have any questions, ask your nurse or doctor.          aspirin EC 81 MG tablet Take 81 mg by mouth every morning.   bismuth-metronidazole-tetracycline 140-125-125 MG capsule Commonly known as: Pylera Take 3 capsules by mouth 4 (four) times daily -  before meals and at bedtime.   cyclobenzaprine 10 MG tablet Commonly known as: FLEXERIL Take 10 mg by mouth 3 (three) times daily as needed.   DULoxetine 20 MG capsule Commonly known as: Cymbalta Take 1 capsule (20 mg total) by mouth daily.   empagliflozin  25 MG Tabs tablet Commonly known as: Jardiance Take 1 tablet (25 mg total) by mouth daily before breakfast.   gabapentin 300 MG capsule Commonly known as: NEURONTIN Take 300 mg by mouth 3 (three) times daily.   glipiZIDE 10 MG tablet Commonly known as: Glucotrol Take 1 tablet (10 mg total) by mouth 2 (two) times daily before a meal.   hydrocortisone 25 MG suppository Commonly known as: ANUSOL-HC Place 1 suppository (25 mg total) rectally 2 (two) times daily.   omeprazole 40 MG capsule Commonly known as: PRILOSEC TAKE ONE CAPSULE BY MOUTH TWICE A DAY 30 MINUTES BEFORE MORNING AND EVENING MEALS   ONE TOUCH ULTRA TEST test strip Generic drug: glucose blood 1 each by Other route Daily.    oxyCODONE-acetaminophen 5-325 MG tablet Commonly known as: PERCOCET/ROXICET Take 1-2 tablets by mouth every 4 (four) hours as needed.   pioglitazone 30 MG tablet Commonly known as: Actos Take 1 tablet (30 mg total) by mouth daily.   simvastatin 40 MG tablet Commonly known as: ZOCOR Take 40 mg by mouth every evening.   tetrahydrozoline 0.05 % ophthalmic solution Place 1 drop into both eyes 4 (four) times daily as needed (For eye irritation.).   valsartan-hydrochlorothiazide 80-12.5 MG tablet Commonly known as: DIOVAN-HCT daily.         ALLERGIES: No Known Allergies   REVIEW OF SYSTEMS: A comprehensive ROS was conducted with the patient and is negative except as per HPI    OBJECTIVE:   VITAL SIGNS: BP 132/80 (BP Location: Left Arm, Patient Position: Sitting, Cuff Size: Large)   Pulse 94   Ht '5\' 7"'$  (1.702 m)   Wt 284 lb (128.8 kg)   SpO2 97%   BMI 44.48 kg/m    PHYSICAL EXAM:  General: Pt appears well and is in NAD  Lungs: Clear with good BS bilat with no rales, rhonchi, or wheezes  Heart: RRR   Extremities:  Lower extremities -Tarce  pretibial edema. Left shin scab noted  Neuro: MS is good with appropriate affect, pt is alert and Ox3    DM foot exam: 07/19/2021  The skin of the feet is intact without sores or ulcerations. The pedal pulses are 2+ on right and 2+ on left. The sensation is intact to a screening 5.07, 10 gram monofilament bilaterally    DATA REVIEWED:  07/06/2021 A1c 8.8%  HDL 39 LDL93 MA 0.7  TSH 4.010 uIU/mL     ASSESSMENT / PLAN / RECOMMENDATIONS:   1) Type 2 Diabetes Mellitus,Sub-optimally  controlled, With neuropathic complications - Most recent A1c of 7.4 %. Goal A1c < 7.0 %.    - A1c has trended down from 8.8% to 7.4%  - Praised pt on improved glycemic control and glucose checks  - We again discussed reducing CHO intake with snacks   MEDICATIONS: Continue Glipizide 10 mg , 1 tablet before Breakfast and 1 tablet before  supper  Continue Pioglitazone 30 mg , 1 tablet daily  Continue Jardiance 25 mg daily   EDUCATION / INSTRUCTIONS: BG monitoring instructions: Patient is instructed to check his blood sugars 1 times a day, fasting . Call [facility] if: BG persistently < 70 I reviewed the Rule of 15 for the treatment of hypoglycemia in detail with the patient. Literature supplied.   2) Diabetic complications:  Eye: Does not have known diabetic retinopathy.  Neuro/ Feet: Does  have known diabetic peripheral neuropathy. Renal: Patient does not have known baseline CKD. He is  on an ACEI/ARB  at present.  3) Dyslipidemia :   LDL acceptable but not at goal at 93 mg/dL. Will continue to monitor and change simvastatin 40 mg if necessary if no improvement   4) Peripheral Neuropathy: - pt encouraged to schedule a follow up with neurology    F/U in 6 months   Signed electronically by: Mack Guise, MD  [facility] Endocrinology  [facility] [address], [place] [address] Phone: [phone] FAX: [phone]   CC: Lujean Amel, [address] [address] Phone: [phone]  Fax: [phone]    Return to Endocrinology clinic as below: No future appointments.

## 2022-04-25 DIAGNOSIS — H0102B Squamous blepharitis left eye, upper and lower eyelids: Secondary | ICD-10-CM | POA: Diagnosis not present

## 2022-04-25 DIAGNOSIS — H2513 Age-related nuclear cataract, bilateral: Secondary | ICD-10-CM | POA: Diagnosis not present

## 2022-04-25 DIAGNOSIS — H16101 Unspecified superficial keratitis, right eye: Secondary | ICD-10-CM | POA: Diagnosis not present

## 2022-04-25 DIAGNOSIS — H0102A Squamous blepharitis right eye, upper and lower eyelids: Secondary | ICD-10-CM | POA: Diagnosis not present

## 2022-05-09 DIAGNOSIS — H0102A Squamous blepharitis right eye, upper and lower eyelids: Secondary | ICD-10-CM | POA: Diagnosis not present

## 2022-05-09 DIAGNOSIS — H16102 Unspecified superficial keratitis, left eye: Secondary | ICD-10-CM | POA: Diagnosis not present

## 2022-05-09 DIAGNOSIS — E119 Type 2 diabetes mellitus without complications: Secondary | ICD-10-CM | POA: Diagnosis not present

## 2022-05-09 DIAGNOSIS — H04123 Dry eye syndrome of bilateral lacrimal glands: Secondary | ICD-10-CM | POA: Diagnosis not present

## 2022-06-09 ENCOUNTER — Encounter: Payer: Self-pay | Admitting: Internal Medicine

## 2022-06-09 ENCOUNTER — Ambulatory Visit (INDEPENDENT_AMBULATORY_CARE_PROVIDER_SITE_OTHER): Payer: BC Managed Care – PPO | Admitting: Internal Medicine

## 2022-06-09 VITALS — BP 124/70 | HR 65 | Ht 67.0 in | Wt 288.0 lb

## 2022-06-09 DIAGNOSIS — E1142 Type 2 diabetes mellitus with diabetic polyneuropathy: Secondary | ICD-10-CM

## 2022-06-09 DIAGNOSIS — Z7985 Long-term (current) use of injectable non-insulin antidiabetic drugs: Secondary | ICD-10-CM

## 2022-06-09 DIAGNOSIS — Z7984 Long term (current) use of oral hypoglycemic drugs: Secondary | ICD-10-CM | POA: Diagnosis not present

## 2022-06-09 DIAGNOSIS — E1165 Type 2 diabetes mellitus with hyperglycemia: Secondary | ICD-10-CM

## 2022-06-09 DIAGNOSIS — E785 Hyperlipidemia, unspecified: Secondary | ICD-10-CM

## 2022-06-09 LAB — POCT GLYCOSYLATED HEMOGLOBIN (HGB A1C): Hemoglobin A1C: 7.7 % — AB (ref 4.0–5.6)

## 2022-06-09 MED ORDER — SEMAGLUTIDE(0.25 OR 0.5MG/DOS) 2 MG/3ML ~~LOC~~ SOPN: 0.5000 mg | PEN_INJECTOR | SUBCUTANEOUS | 3 refills | Status: DC

## 2022-06-09 NOTE — Progress Notes (Signed)
Name: Brian Kennedy  MRN/ DOB: [id number], 03/20/1960   Age/ Sex: 62 y.o., male    PCP: Darrow Bussing, MD   Reason for Endocrinology Evaluation: Type 2 Diabetes Mellitus     Date of Initial Endocrinology Visit: 07/19/2021    PATIENT IDENTIFIER: Brian Kennedy is a 62 y.o. male with a past medical history of T2DM and dyslipidemia . The patient presented for initial endocrinology clinic visit on 07/19/2021  for consultative assistance with his diabetes management.    HPI: Brian Kennedy was    Diagnosed with DM >20 yrs  Prior Medications tried/Intolerance: Metformin caused nausea                Hemoglobin A1c has ranged from 8.0% in 2023, peaking at 9.8% in 2021.   He has chronic feet pain , attributes to neuropathy . ON Cymbalta  and Gabapentin   On his initial visit to our clinic he had an A1c of 8.8%, patient was on Jardiance, pioglitazone, and glipizide.  We increased glipizide and pioglitazone and continue Jardiance SUBJECTIVE:   During the last visit (12/06/2021): A1c 7.4%      Today (06/09/22): Brian Kennedy is here for a follow up on diabetes management. He checks his blood sugars 3-4 times daily. The patient has not had hypoglycemic episodes since the last clinic visit  Denies nausea , vomiting No diarrhea or constipation  He has not had a follow up with neurology as he does not believe they have been helpful, patient continues with neuropathic symptoms of the feet     HOME DIABETES REGIMEN: Glipizide 10 mg twice daily Pioglitazone 30 mg daily  Jardiance 25 mg daily    Statin: yes ACE-I/ARB: yes    METER DOWNLOAD SUMMARY: 5/1-5/30/2024 Fingerstick Blood Glucose Tests = 32 Average Number Tests/Day = 2 Overall Mean FS Glucose = 130 Standard Deviation = 32  BG Ranges: Low = 73 High = 218  BG Target % Results: % In target = 92 % Over target = 8 % Under target = 0  Hypoglycemic Events/30 Days: BG < 50 = 0 Episodes of symptomatic severe  hypoglycemia = 0     DIABETIC COMPLICATIONS: Microvascular complications:  Neuropathy-follows with neurology Denies: CKD Last eye exam: Completed 2 yrs ago   Macrovascular complications:   Denies: CAD, PVD, CVA   PAST HISTORY: Past Medical History:  Past Medical History:  Diagnosis Date   Acne keloidalis nuchae    Diabetes mellitus    Hypercholesterolemia    Hypertension    Past Surgical History:  Past Surgical History:  Procedure Laterality Date   CYST REMOVAL NECK     HERNIA REPAIR      Social History:  reports that he has never smoked. He has never used smokeless tobacco. He reports that he does not drink alcohol and does not use drugs. Family History:  Family History  Problem Relation Age of Onset   Diabetes Mother    Breast cancer Mother    Stomach cancer Neg Hx    Colon cancer Neg Hx      HOME MEDICATIONS: Allergies as of 06/09/2022   No Known Allergies      Medication List        Accurate as of Jun 09, 2022  8:41 AM. If you have any questions, ask your nurse or doctor.          aspirin EC 81 MG tablet Take 81 mg by mouth every morning.   bismuth-metronidazole-tetracycline 140-125-125 MG  capsule Commonly known as: Pylera Take 3 capsules by mouth 4 (four) times daily -  before meals and at bedtime.   cyclobenzaprine 10 MG tablet Commonly known as: FLEXERIL Take 10 mg by mouth 3 (three) times daily as needed.   DULoxetine 20 MG capsule Commonly known as: Cymbalta Take 1 capsule (20 mg total) by mouth daily.   empagliflozin 25 MG Tabs tablet Commonly known as: Jardiance Take 1 tablet (25 mg total) by mouth daily before breakfast.   gabapentin 300 MG capsule Commonly known as: NEURONTIN Take 300 mg by mouth 3 (three) times daily.   glipiZIDE 10 MG tablet Commonly known as: Glucotrol Take 1 tablet (10 mg total) by mouth 2 (two) times daily before a meal.   hydrocortisone 25 MG suppository Commonly known as: ANUSOL-HC Place 1  suppository (25 mg total) rectally 2 (two) times daily.   omeprazole 40 MG capsule Commonly known as: PRILOSEC TAKE ONE CAPSULE BY MOUTH TWICE A DAY 30 MINUTES BEFORE MORNING AND EVENING MEALS   ONE TOUCH ULTRA TEST test strip Generic drug: glucose blood 1 each by Other route Daily.   oxyCODONE-acetaminophen 5-325 MG tablet Commonly known as: PERCOCET/ROXICET Take 1-2 tablets by mouth every 4 (four) hours as needed.   pioglitazone 30 MG tablet Commonly known as: Actos Take 1 tablet (30 mg total) by mouth daily.   simvastatin 40 MG tablet Commonly known as: ZOCOR Take 40 mg by mouth every evening.   tetrahydrozoline 0.05 % ophthalmic solution Place 1 drop into both eyes 4 (four) times daily as needed (For eye irritation.).   valsartan-hydrochlorothiazide 80-12.5 MG tablet Commonly known as: DIOVAN-HCT daily.         ALLERGIES: No Known Allergies   REVIEW OF SYSTEMS: A comprehensive ROS was conducted with the patient and is negative except as per HPI    OBJECTIVE:   VITAL SIGNS: BP 124/70 (BP Location: Left Arm, Patient Position: Sitting, Cuff Size: Large)   Pulse 65   Ht 5\' 7"  (1.702 m)   Wt 288 lb (130.6 kg)   SpO2 99%   BMI 45.11 kg/m    PHYSICAL EXAM:  General: Pt appears well and is in NAD  Lungs: Clear with good BS bilat with no rales, rhonchi, or wheezes  Heart: RRR   Extremities:  Lower extremities -Tarce  pretibial edema. Left shin scab noted  Neuro: MS is good with appropriate affect, pt is alert and Ox3    DM foot exam: 06/09/2022  The skin of the feet is intact without sores or ulcerations. The pedal pulses are 2+ on right and 2+ on left. The sensation is intact to a screening 5.07, 10 gram monofilament bilaterally    DATA REVIEWED:  07/06/2021 A1c 8.8%  HDL 39 LDL93 MA 0.7  TSH 4.010 uIU/mL     ASSESSMENT / PLAN / RECOMMENDATIONS:   1) Type 2 Diabetes Mellitus,Sub-optimally  controlled, With neuropathic complications - Most  recent A1c of 7.7 %. Goal A1c < 7.0 %.    -A1c continues to be above goal -I have recommended GLP-1 agonist, caution against GI side effects, we discussed cardiovascular, renal, and weight loss benefits -I have recommended reducing glipizide preemptively to prevent hypoglycemia while on Ozempic as below - Pt is past due for labs, I offered to draw labs today but he would like to postpone   MEDICATIONS: Start Ozempic 0.25 mg weekly for 6 weeks, then increase to 0.5 mg weekly Change  Glipizide 10 mg , half a tablet before  Breakfast and 1 tablet before supper  Continue Pioglitazone 30 mg , 1 tablet daily  Continue Jardiance 25 mg daily   EDUCATION / INSTRUCTIONS: BG monitoring instructions: Patient is instructed to check his blood sugars 1 times a day, fasting . Call [facility] if: BG persistently < 70 I reviewed the Rule of 15 for the treatment of hypoglycemia in detail with the patient. Literature supplied.   2) Diabetic complications:  Eye: Does not have known diabetic retinopathy.  Neuro/ Feet: Does  have known diabetic peripheral neuropathy. Renal: Patient does not have known baseline CKD. He is  on an ACEI/ARB at present.  3) Dyslipidemia :   -LDL acceptable but not at goal at 93 mg/dL.  -Patient declined labs today, will prefer to do them at his PCPs office  F/U in 6 months   Signed electronically by: Lyndle Herrlich, MD  [facility] End[facility]ology  [facility] [address] [place] [place], [place] [address] Phone: [phone] FAX: [phone]   CC: Darrow Bussing, MD [address] [place] [address] Phone: [phone]  Fax: [phone]    Return to Endocrinology clinic as below: No future appointments.

## 2022-06-09 NOTE — Patient Instructions (Signed)
Start Ozempic 0.25 mg once weekly for 6 weeks, than increase to 0.5 mg weekly  Change Glipizide 10 mg , Half  tablet before Breakfast and 1 tablet before supper  Continue Pioglitazone 30 mg , 1 tablet daily  Continue Jardiance 25 mg daily     HOW TO TREAT LOW BLOOD SUGARS (Blood sugar LESS THAN 70 MG/DL) Please follow the RULE OF 15 for the treatment of hypoglycemia treatment (when your (blood sugars are less than 70 mg/dL)   STEP 1: Take 15 grams of carbohydrates when your blood sugar is low, which includes:  3-4 GLUCOSE TABS  OR 3-4 OZ OF JUICE OR REGULAR SODA OR ONE TUBE OF GLUCOSE GEL    STEP 2: RECHECK blood sugar in 15 MINUTES STEP 3: If your blood sugar is still low at the 15 minute recheck --> then, go back to STEP 1 and treat AGAIN with another 15 grams of carbohydrates.

## 2022-06-10 MED ORDER — EMPAGLIFLOZIN 25 MG PO TABS: 25.0000 mg | ORAL_TABLET | Freq: Every day | ORAL | 3 refills | Status: DC

## 2022-06-10 MED ORDER — GLIPIZIDE 10 MG PO TABS: ORAL_TABLET | ORAL | 3 refills | Status: DC

## 2022-06-10 MED ORDER — PIOGLITAZONE HCL 30 MG PO TABS: 30.0000 mg | ORAL_TABLET | Freq: Every day | ORAL | 3 refills | Status: DC

## 2022-06-26 ENCOUNTER — Other Ambulatory Visit: Payer: Self-pay | Admitting: Internal Medicine

## 2022-10-19 ENCOUNTER — Encounter: Payer: Self-pay | Admitting: Internal Medicine

## 2022-10-19 ENCOUNTER — Ambulatory Visit (INDEPENDENT_AMBULATORY_CARE_PROVIDER_SITE_OTHER): Payer: BC Managed Care – PPO | Admitting: Internal Medicine

## 2022-10-19 VITALS — BP 122/80 | HR 74 | Ht 67.0 in | Wt 269.0 lb

## 2022-10-19 DIAGNOSIS — Z7985 Long-term (current) use of injectable non-insulin antidiabetic drugs: Secondary | ICD-10-CM

## 2022-10-19 DIAGNOSIS — Z7984 Long term (current) use of oral hypoglycemic drugs: Secondary | ICD-10-CM

## 2022-10-19 DIAGNOSIS — E1165 Type 2 diabetes mellitus with hyperglycemia: Secondary | ICD-10-CM

## 2022-10-19 DIAGNOSIS — E1142 Type 2 diabetes mellitus with diabetic polyneuropathy: Secondary | ICD-10-CM

## 2022-10-19 LAB — LIPID PANEL
Cholesterol: 102 mg/dL (ref 0–200)
HDL: 38.8 mg/dL — ABNORMAL LOW (ref >39.00)
LDL Cholesterol: 38 mg/dL (ref 0–99)
NonHDL: 63.31
Total CHOL/HDL Ratio: 3
Triglycerides: 128 mg/dL (ref 0.0–149.0)
VLDL: 25.6 mg/dL (ref 0.0–40.0)

## 2022-10-19 LAB — TSH: TSH: 4.53 u[IU]/mL (ref 0.35–5.50)

## 2022-10-19 LAB — BASIC METABOLIC PANEL
BUN: 17 mg/dL (ref 6–23)
CO2: 26 meq/L (ref 19–32)
Calcium: 9.7 mg/dL (ref 8.4–10.5)
Chloride: 106 meq/L (ref 96–112)
Creatinine, Ser: 0.97 mg/dL (ref 0.40–1.50)
GFR: 82.94 mL/min (ref >60.00)
Glucose, Bld: 128 mg/dL — ABNORMAL HIGH (ref 70–99)
Potassium: 3.6 meq/L (ref 3.5–5.1)
Sodium: 141 meq/L (ref 135–145)

## 2022-10-19 LAB — MICROALBUMIN / CREATININE URINE RATIO
Creatinine,U: 146.1 mg/dL
Microalb Creat Ratio: 1.9 mg/g (ref 0.0–30.0)
Microalb, Ur: 2.7 mg/dL — ABNORMAL HIGH (ref 0.0–1.9)

## 2022-10-19 LAB — POCT GLYCOSYLATED HEMOGLOBIN (HGB A1C): Hemoglobin A1C: 6.6 % — AB (ref 4.0–5.6)

## 2022-10-19 MED ORDER — EMPAGLIFLOZIN 25 MG PO TABS: 25.0000 mg | ORAL_TABLET | Freq: Every day | ORAL | 3 refills | Status: DC

## 2022-10-19 MED ORDER — PIOGLITAZONE HCL 15 MG PO TABS: 15.0000 mg | ORAL_TABLET | Freq: Every day | ORAL | 3 refills | Status: DC

## 2022-10-19 MED ORDER — SEMAGLUTIDE (1 MG/DOSE) 4 MG/3ML ~~LOC~~ SOPN: 1.0000 mg | PEN_INJECTOR | SUBCUTANEOUS | 3 refills | Status: DC

## 2022-10-19 MED ORDER — GLIPIZIDE 5 MG PO TABS: 5.0000 mg | ORAL_TABLET | Freq: Two times a day (BID) | ORAL | 3 refills | Status: DC

## 2022-10-19 NOTE — Progress Notes (Unsigned)
Name: Brian Kennedy  MRN/ DOB: [id number], [birth year]   Age/ Sex: 62 y.o., male    PCP: Darrow Bussing, MD   Reason for Endocrinology Evaluation: Type 2 Diabetes Mellitus     Date of Initial Endocrinology Visit: 07/19/2021    PATIENT IDENTIFIER: Brian Kennedy is a 62 y.o. male with a past medical history of T2DM and dyslipidemia . The patient presented for initial endocrinology clinic visit on 07/19/2021  for consultative assistance with his diabetes management.    HPI: Brian Kennedy was    Diagnosed with DM >20 yrs  Prior Medications tried/Intolerance: Metformin caused nausea                Hemoglobin A1c has ranged from 8.0% in 2023, peaking at 9.8% in 2021.   He has chronic feet pain , attributes to neuropathy . ON Cymbalta  and Gabapentin   On his initial visit to our clinic he had an A1c of 8.8%, patient was on Jardiance, pioglitazone, and glipizide.  We increased glipizide and pioglitazone and continue Jardiance   Ozempic started 05/2022 SUBJECTIVE:   During the last visit (06/09/2022): A1c 7.7%      Today (10/19/22): Brian Kennedy is here for a follow up on diabetes management. He checks his blood sugars 3-4 times daily. The patient has not had hypoglycemic episodes since the last clinic visit  Denies nausea , vomiting No diarrhea or constipation      HOME DIABETES REGIMEN: Glipizide 10 mg, half a tablet before breakfast and 1 tablet before supper Pioglitazone 30 mg daily  Jardiance 25 mg daily  Ozempic 0.5 mg weekly   Statin: yes ACE-I/ARB: yes    METER DOWNLOAD SUMMARY: 80-153 mg/dL      DIABETIC COMPLICATIONS: Microvascular complications:  Neuropathy-follows with neurology Denies: CKD Last eye exam: Completed 2 yrs ago   Macrovascular complications:   Denies: CAD, PVD, CVA   PAST HISTORY: Past Medical History:  Past Medical History:  Diagnosis Date   Acne keloidalis nuchae    Diabetes mellitus    Hypercholesterolemia     Hypertension    Past Surgical History:  Past Surgical History:  Procedure Laterality Date   CYST REMOVAL NECK     HERNIA REPAIR      Social History:  reports that he has never smoked. He has never used smokeless tobacco. He reports that he does not drink alcohol and does not use drugs. Family History:  Family History  Problem Relation Age of Onset   Diabetes Mother    Breast cancer Mother    Stomach cancer Neg Hx    Colon cancer Neg Hx      HOME MEDICATIONS: Allergies as of 10/19/2022   No Known Allergies      Medication List        Accurate as of October 19, 2022  8:08 AM. If you have any questions, ask your nurse or doctor.          aspirin EC 81 MG tablet Take 81 mg by mouth every morning.   bismuth-metronidazole-tetracycline 140-125-125 MG capsule Commonly known as: Pylera Take 3 capsules by mouth 4 (four) times daily -  before meals and at bedtime.   cyclobenzaprine 10 MG tablet Commonly known as: FLEXERIL Take 10 mg by mouth 3 (three) times daily as needed.   DULoxetine 20 MG capsule Commonly known as: Cymbalta Take 1 capsule (20 mg total) by mouth daily.   empagliflozin 25 MG Tabs tablet Commonly known as: Jardiance Take  1 tablet (25 mg total) by mouth daily before breakfast.   gabapentin 300 MG capsule Commonly known as: NEURONTIN Take 300 mg by mouth 3 (three) times daily.   glipiZIDE 10 MG tablet Commonly known as: Glucotrol Take 0.5 tablets (5 mg total) by mouth daily before breakfast AND 1 tablet (10 mg total) daily before supper.   hydrocortisone 25 MG suppository Commonly known as: ANUSOL-HC Place 1 suppository (25 mg total) rectally 2 (two) times daily.   omeprazole 40 MG capsule Commonly known as: PRILOSEC TAKE ONE CAPSULE BY MOUTH TWICE A DAY 30 MINUTES BEFORE MORNING AND EVENING MEALS   ONE TOUCH ULTRA TEST test strip Generic drug: glucose blood 1 each by Other route Daily.   oxyCODONE-acetaminophen 5-325 MG tablet Commonly  known as: PERCOCET/ROXICET Take 1-2 tablets by mouth every 4 (four) hours as needed.   pioglitazone 30 MG tablet Commonly known as: ACTOS TAKE 1 TABLET DAILY   Semaglutide(0.25 or 0.5MG /DOS) 2 MG/3ML Sopn Inject 0.5 mg into the skin once a week.   simvastatin 40 MG tablet Commonly known as: ZOCOR Take 40 mg by mouth every evening.   tetrahydrozoline 0.05 % ophthalmic solution Place 1 drop into both eyes 4 (four) times daily as needed (For eye irritation.).   valsartan-hydrochlorothiazide 80-12.5 MG tablet Commonly known as: DIOVAN-HCT daily.         ALLERGIES: No Known Allergies   REVIEW OF SYSTEMS: A comprehensive ROS was conducted with the patient and is negative except as per HPI    OBJECTIVE:   VITAL SIGNS: BP 122/80 (BP Location: Left Arm, Patient Position: Sitting, Cuff Size: Large)   Pulse 74   Ht 5\' 7"  (1.702 m)   Wt 269 lb (122 kg)   SpO2 96%   BMI 42.13 kg/m    PHYSICAL EXAM:  General: Pt appears well and is in NAD  Lungs: Clear with good BS bilat with no rales, rhonchi, or wheezes  Heart: RRR   Extremities:  Lower extremities -Tarce  pretibial edema. Left shin scab noted  Neuro: MS is good with appropriate affect, pt is alert and Ox3    DM foot exam: 06/09/2022  The skin of the feet is intact without sores or ulcerations. The pedal pulses are 2+ on right and 2+ on left. The sensation is intact to a screening 5.07, 10 gram monofilament bilaterally    DATA REVIEWED:  07/06/2021 A1c 8.8%  HDL 39 LDL93 MA 0.7  TSH 4.010 uIU/mL     ASSESSMENT / PLAN / RECOMMENDATIONS:   1) Type 2 Diabetes Mellitus,Optimally  controlled, With neuropathic complications - Most recent A1c of 6.6 %. Goal A1c < 7.0 %.    -      MEDICATIONS: Start Ozempic 0.25 mg weekly for 6 weeks, then increase to 0.5 mg weekly Change  Glipizide 10 mg , half a tablet before Breakfast and 1 tablet before supper  Continue Pioglitazone 30 mg , 1 tablet daily  Continue  Jardiance 25 mg daily   EDUCATION / INSTRUCTIONS: BG monitoring instructions: Patient is instructed to check his blood sugars 1 times a day, fasting . Call [facility] if: BG persistently < 70 I reviewed the Rule of 15 for the treatment of hypoglycemia in detail with the patient. Literature supplied.   2) Diabetic complications:  Eye: Does not have known diabetic retinopathy.  Neuro/ Feet: Does  have known diabetic peripheral neuropathy. Renal: Patient does not have known baseline CKD. He is  on an ACEI/ARB at present.  3) Dyslipidemia :   -LDL acceptable but not at goal at 93 mg/dL.  -Patient declined labs today, will prefer to do them at his PCPs office  F/U in 6 months   Signed electronically by: Lyndle Herrlich, MD  [facility]  [facility] [address] [place] [place], [place] [address] Phone: [phone] FAX: [phone]   CC: Darrow Bussing, MD [address] [place] [address] Phone: [phone]  Fax: [phone]    Return to Endocrinology clinic as below: No future appointments.

## 2022-10-19 NOTE — Patient Instructions (Signed)
INcrease Ozempic 1 mg once weekly  Change Glipizide 5 mg , before Breakfast and 5 mg before supper  Decrease Pioglitazone 15 mg , 1 tablet daily  Continue Jardiance 25 mg daily     HOW TO TREAT LOW BLOOD SUGARS (Blood sugar LESS THAN 70 MG/DL) Please follow the RULE OF 15 for the treatment of hypoglycemia treatment (when your (blood sugars are less than 70 mg/dL)   STEP 1: Take 15 grams of carbohydrates when your blood sugar is low, which includes:  3-4 GLUCOSE TABS  OR 3-4 OZ OF JUICE OR REGULAR SODA OR ONE TUBE OF GLUCOSE GEL    STEP 2: RECHECK blood sugar in 15 MINUTES STEP 3: If your blood sugar is still low at the 15 minute recheck --> then, go back to STEP 1 and treat AGAIN with another 15 grams of carbohydrates.

## 2022-10-20 ENCOUNTER — Encounter: Payer: Self-pay | Admitting: Internal Medicine

## 2022-12-13 ENCOUNTER — Ambulatory Visit: Payer: BC Managed Care – PPO | Admitting: Internal Medicine

## 2023-01-08 ENCOUNTER — Other Ambulatory Visit: Payer: Self-pay | Admitting: Internal Medicine

## 2023-01-09 ENCOUNTER — Other Ambulatory Visit: Payer: Self-pay

## 2023-01-27 ENCOUNTER — Ambulatory Visit: Payer: BC Managed Care – PPO | Admitting: Internal Medicine

## 2023-03-10 DIAGNOSIS — E785 Hyperlipidemia, unspecified: Secondary | ICD-10-CM | POA: Diagnosis not present

## 2023-03-10 DIAGNOSIS — Z0000 Encounter for general adult medical examination without abnormal findings: Secondary | ICD-10-CM | POA: Diagnosis not present

## 2023-03-10 DIAGNOSIS — E1165 Type 2 diabetes mellitus with hyperglycemia: Secondary | ICD-10-CM | POA: Diagnosis not present

## 2023-03-10 DIAGNOSIS — I10 Essential (primary) hypertension: Secondary | ICD-10-CM | POA: Diagnosis not present

## 2023-03-10 DIAGNOSIS — Z23 Encounter for immunization: Secondary | ICD-10-CM | POA: Diagnosis not present

## 2023-03-10 DIAGNOSIS — Z79899 Other long term (current) drug therapy: Secondary | ICD-10-CM | POA: Diagnosis not present

## 2023-03-10 DIAGNOSIS — Z125 Encounter for screening for malignant neoplasm of prostate: Secondary | ICD-10-CM | POA: Diagnosis not present

## 2023-03-10 DIAGNOSIS — E7800 Pure hypercholesterolemia, unspecified: Secondary | ICD-10-CM | POA: Diagnosis not present

## 2023-03-15 DIAGNOSIS — R053 Chronic cough: Secondary | ICD-10-CM | POA: Diagnosis not present

## 2023-03-15 DIAGNOSIS — J329 Chronic sinusitis, unspecified: Secondary | ICD-10-CM | POA: Diagnosis not present

## 2023-03-15 DIAGNOSIS — J411 Mucopurulent chronic bronchitis: Secondary | ICD-10-CM | POA: Diagnosis not present

## 2023-04-18 NOTE — Progress Notes (Unsigned)
 Name: Brian Kennedy  MRN/ DOB: [id number], 1960-10-08   Age/ Sex: 63 y.o., male    PCP: Darrow Bussing, MD   Reason for Endocrinology Evaluation: Type 2 Diabetes Mellitus     Date of Initial Endocrinology Visit: 07/19/2021    PATIENT IDENTIFIER: [name] is a 63 y.o. male with a past medical history of T2DM and dyslipidemia . The patient presented for initial endocrinology clinic visit on 07/19/2021  for consultative assistance with his diabetes management.    HPI: [name] was    Diagnosed with DM >20 yrs  Prior Medications tried/Intolerance: Metformin caused nausea                Hemoglobin A1c has ranged from 8.0% in 2023, peaking at 9.8% in 2021.   He has chronic feet pain , attributes to neuropathy . ON Cymbalta  and Gabapentin   On his initial visit to our clinic he had an A1c of 8.8%, patient was on Jardiance, pioglitazone, and glipizide.  We increased glipizide and pioglitazone and continue Jardiance   Ozempic started 05/2022   SUBJECTIVE:   During the last visit (10/19/2022): A1c 6.6%      Today (04/19/23): [name] is here for a follow up on diabetes management. He checks his blood sugars 2 times daily. The patient has had hypoglycemic episodes since the last clinic visit. He is symptomatic   Weight has been stable  Denies nausea , vomiting No diarrhea or constipation  No  LE edema but has occasional right leg pain, with tingling and numbness  He continues with left lower extremity dry skin, patient advised to use a moisturizer  HOME DIABETES REGIMEN: Glipizide 5 mg, BID Pioglitazone 15 mg daily  Jardiance 25 mg daily  Ozempic 1 mg weekly    Statin: yes ACE-I/ARB: yes    METER DOWNLOAD SUMMARY: 3/11-04/19/2023 Fingerstick Blood Glucose Tests = 33 Average Number Tests/Day = 1 Overall Mean FS Glucose = 115 Standard Deviation = 21  BG Ranges: Low = 69 High = 159  BG Target % Results: % In target = 97 % Over target = 0 %  Under target = 3  Hypoglycemic Events/30 Days: BG < 50 = 0 Episodes of symptomatic severe hypoglycemia = 0     DIABETIC COMPLICATIONS: Microvascular complications:  Neuropathy-follows with neurology Denies: CKD Last eye exam: Completed 2 yrs ago   Macrovascular complications:   Denies: CAD, PVD, CVA   PAST HISTORY: Past Medical History:  Past Medical History:  Diagnosis Date   Acne keloidalis nuchae    Diabetes mellitus    Hypercholesterolemia    Hypertension    Past Surgical History:  Past Surgical History:  Procedure Laterality Date   CYST REMOVAL NECK     HERNIA REPAIR      Social History:  reports that he has never smoked. He has never used smokeless tobacco. He reports that he does not drink alcohol and does not use drugs. Family History:  Family History  Problem Relation Age of Onset   Diabetes Mother    Breast cancer Mother    Stomach cancer Neg Hx    Colon cancer Neg Hx      HOME MEDICATIONS: Allergies as of 04/19/2023   No Known Allergies      Medication List        Accurate as of April 19, 2023  8:12 AM. If you have any questions, ask your nurse or doctor.  aspirin EC 81 MG tablet Take 81 mg by mouth every morning.   bismuth-metronidazole-tetracycline 140-125-125 MG capsule Commonly known as: Pylera Take 3 capsules by mouth 4 (four) times daily -  before meals and at bedtime.   cyclobenzaprine 10 MG tablet Commonly known as: FLEXERIL Take 10 mg by mouth 3 (three) times daily as needed.   DULoxetine 20 MG capsule Commonly known as: Cymbalta Take 1 capsule (20 mg total) by mouth daily.   empagliflozin 25 MG Tabs tablet Commonly known as: Jardiance Take 1 tablet (25 mg total) by mouth daily before breakfast.   gabapentin 300 MG capsule Commonly known as: NEURONTIN Take 300 mg by mouth 3 (three) times daily.   glipiZIDE 5 MG tablet Commonly known as: GLUCOTROL Take 1 tablet (5 mg total) by mouth 2 (two) times daily  before a meal.   hydrocortisone 25 MG suppository Commonly known as: ANUSOL-HC Place 1 suppository (25 mg total) rectally 2 (two) times daily.   omeprazole 40 MG capsule Commonly known as: PRILOSEC TAKE ONE CAPSULE BY MOUTH TWICE A DAY 30 MINUTES BEFORE MORNING AND EVENING MEALS   ONE TOUCH ULTRA TEST test strip Generic drug: glucose blood 1 each by Other route Daily.   OneTouch UltraSoft 2 Lancets Misc SMARTSIG:Lancet   oxyCODONE-acetaminophen 5-325 MG tablet Commonly known as: PERCOCET/ROXICET Take 1-2 tablets by mouth every 4 (four) hours as needed.   pioglitazone 15 MG tablet Commonly known as: Actos Take 1 tablet (15 mg total) by mouth daily.   Semaglutide (1 MG/DOSE) 4 MG/3ML Sopn Inject 1 mg as directed once a week.   simvastatin 40 MG tablet Commonly known as: ZOCOR Take 40 mg by mouth every evening.   tetrahydrozoline 0.05 % ophthalmic solution Place 1 drop into both eyes 4 (four) times daily as needed (For eye irritation.).   valsartan-hydrochlorothiazide 80-12.5 MG tablet Commonly known as: DIOVAN-HCT daily.         ALLERGIES: No Known Allergies   REVIEW OF SYSTEMS: A comprehensive ROS was conducted with the patient and is negative except as per HPI    OBJECTIVE:   VITAL SIGNS: BP 126/82 (BP Location: Left Arm, Patient Position: Sitting, Cuff Size: Normal)   Pulse 73   Ht 5\' 7"  (1.702 m)   Wt 269 lb (122 kg)   SpO2 96%   BMI 42.13 kg/m      Filed Weights   04/19/23 0804  Weight: 269 lb (122 kg)     PHYSICAL EXAM:  General: Pt appears well and is in NAD  Lungs: Clear with good BS bilat   Heart: RRR   Extremities:  Lower extremities -Tarce  pretibial edema. Left shin scabs noted  Neuro: MS is good with appropriate affect, pt is alert and Ox3    DM foot exam: 04/19/2023  The skin of the feet is intact without sores or ulcerations. The pedal pulses are 2+ on right and 2+ on left. The sensation is intact to a screening 5.07, 10  gram monofilament bilaterally    DATA REVIEWED:   Latest Reference Range & Units 10/19/22 08:20  Sodium 135 - 145 mEq/L 141  Potassium 3.5 - 5.1 mEq/L 3.6  Chloride 96 - 112 mEq/L 106  CO2 19 - 32 mEq/L 26  Glucose 70 - 99 mg/dL 161 (H)  BUN 6 - 23 mg/dL 17  Creatinine 0.96 - 0.45 mg/dL 4.09  Calcium 8.4 - 81.1 mg/dL 9.7  GFR >91.47 mL/min 82.94  Total CHOL/HDL Ratio  3  Cholesterol 0 -  200 mg/dL 952  HDL Cholesterol >84.13 mg/dL 24.40 (L)  LDL (calc) 0 - 99 mg/dL 38  MICROALB/CREAT RATIO 0.0 - 30.0 mg/g 1.9  NonHDL  63.31  Triglycerides 0.0 - 149.0 mg/dL 102.7  VLDL 0.0 - 25.3 mg/dL 66.4     Latest Reference Range & Units 10/19/22 08:20  Creatinine,U mg/dL 403.4  Microalb, Ur 0.0 - 1.9 mg/dL 2.7 (H)  MICROALB/CREAT RATIO 0.0 - 30.0 mg/g 1.9    ASSESSMENT / PLAN / RECOMMENDATIONS:   1) Type 2 Diabetes Mellitus,Optimally  controlled, With neuropathic complications - Most recent A1c of 6.6 %. Goal A1c < 7.0 %.    -I have praised the patient improved glycemic control -He is tolerating Ozempic without any side effects, patient would like to remain on current dose at this time -Patient has been noted with hypoglycemia during the day, will discontinue pioglitazone  MEDICATIONS: - Start pioglitazone - Continue Ozempic 1 mg weekly  - Continue Glipizide 5 mg , 1 tablet before Breakfast and 1 tablet before supper  - Continue Jardiance 25 mg daily   EDUCATION / INSTRUCTIONS: BG monitoring instructions: Patient is instructed to check his blood sugars 1 times a day, fasting . Call [facility] if: BG persistently < 70 I reviewed the Rule of 15 for the treatment of hypoglycemia in detail with the patient. Literature supplied.   2) Diabetic complications:  Eye: Does not have known diabetic retinopathy.  Neuro/ Feet: Does  have known diabetic peripheral neuropathy. Renal: Patient does not have known baseline CKD. He is  on an ACEI/ARB at present.  3)  Dyslipidemia :   -LDL at goal   Medication Continue simvastatin 40 mg daily  F/U in 6 months   Signed electronically by: Lyndle Herrlich, MD  [facility] End[facility]  [facility] [address] [place], [place] [address] Phone: [phone] FAX: [phone]   CC: Darrow Bussing, MD [address] [place] [address] Phone: [phone]  Fax: [phone]    Return to Endocrinology clinic as below: No future appointments.

## 2023-04-19 ENCOUNTER — Encounter: Payer: Self-pay | Admitting: Internal Medicine

## 2023-04-19 ENCOUNTER — Ambulatory Visit (INDEPENDENT_AMBULATORY_CARE_PROVIDER_SITE_OTHER): Payer: BC Managed Care – PPO | Admitting: Internal Medicine

## 2023-04-19 VITALS — BP 126/82 | HR 73 | Ht 67.0 in | Wt 269.0 lb

## 2023-04-19 DIAGNOSIS — Z7984 Long term (current) use of oral hypoglycemic drugs: Secondary | ICD-10-CM | POA: Diagnosis not present

## 2023-04-19 DIAGNOSIS — E1142 Type 2 diabetes mellitus with diabetic polyneuropathy: Secondary | ICD-10-CM | POA: Diagnosis not present

## 2023-04-19 DIAGNOSIS — E785 Hyperlipidemia, unspecified: Secondary | ICD-10-CM | POA: Diagnosis not present

## 2023-04-19 DIAGNOSIS — Z7985 Long-term (current) use of injectable non-insulin antidiabetic drugs: Secondary | ICD-10-CM

## 2023-04-19 LAB — POCT GLYCOSYLATED HEMOGLOBIN (HGB A1C): Hemoglobin A1C: 6.6 % — AB (ref 4.0–5.6)

## 2023-04-19 MED ORDER — GLIPIZIDE 5 MG PO TABS: 5.0000 mg | ORAL_TABLET | Freq: Two times a day (BID) | ORAL | 3 refills | Status: DC

## 2023-04-19 MED ORDER — EMPAGLIFLOZIN 25 MG PO TABS: 25.0000 mg | ORAL_TABLET | Freq: Every day | ORAL | 3 refills | Status: DC

## 2023-04-19 MED ORDER — SEMAGLUTIDE (1 MG/DOSE) 4 MG/3ML ~~LOC~~ SOPN: 1.0000 mg | PEN_INJECTOR | SUBCUTANEOUS | 3 refills | Status: DC

## 2023-04-19 NOTE — Patient Instructions (Signed)
 Continue Ozempic 1 mg once weekly  Continue Glipizide 5 mg , before Breakfast and 5 mg before supper  Stop Pioglitazone 15 mg , 1 tablet daily  Continue Jardiance 25 mg daily     HOW TO TREAT LOW BLOOD SUGARS (Blood sugar LESS THAN 70 MG/DL) Please follow the RULE OF 15 for the treatment of hypoglycemia treatment (when your (blood sugars are less than 70 mg/dL)   STEP 1: Take 15 grams of carbohydrates when your blood sugar is low, which includes:  3-4 GLUCOSE TABS  OR 3-4 OZ OF JUICE OR REGULAR SODA OR ONE TUBE OF GLUCOSE GEL    STEP 2: RECHECK blood sugar in 15 MINUTES STEP 3: If your blood sugar is still low at the 15 minute recheck --> then, go back to STEP 1 and treat AGAIN with another 15 grams of carbohydrates.

## 2023-05-12 DIAGNOSIS — H04123 Dry eye syndrome of bilateral lacrimal glands: Secondary | ICD-10-CM | POA: Diagnosis not present

## 2023-05-12 DIAGNOSIS — H0102A Squamous blepharitis right eye, upper and lower eyelids: Secondary | ICD-10-CM | POA: Diagnosis not present

## 2023-05-12 DIAGNOSIS — E119 Type 2 diabetes mellitus without complications: Secondary | ICD-10-CM | POA: Diagnosis not present

## 2023-05-12 DIAGNOSIS — H2513 Age-related nuclear cataract, bilateral: Secondary | ICD-10-CM | POA: Diagnosis not present

## 2023-07-27 ENCOUNTER — Telehealth: Payer: Self-pay

## 2023-07-27 NOTE — Telephone Encounter (Signed)
 Pharmacy Patient Advocate Encounter   Received notification from Onbase that prior authorization for Jardiance  25mg  is required/requested.   Insurance verification completed.   The patient is insured through [employer] .   Per test claim: PA required; PA submitted to above mentioned insurance via Fax Key/confirmation #/EOC [id number] Status is pending  Faxed to [phone]

## 2023-08-02 NOTE — Telephone Encounter (Signed)
 Pharmacy Patient Advocate Encounter  Received notification from [employer] that Prior Authorization for Jardiance  25mg  has been APPROVED from 06/27/23 to 07/26/24

## 2023-10-16 ENCOUNTER — Other Ambulatory Visit

## 2023-10-16 ENCOUNTER — Ambulatory Visit: Admitting: Internal Medicine

## 2023-10-16 ENCOUNTER — Encounter: Payer: Self-pay | Admitting: Internal Medicine

## 2023-10-16 VITALS — HR 55 | Ht 67.0 in | Wt 261.0 lb

## 2023-10-16 DIAGNOSIS — E1142 Type 2 diabetes mellitus with diabetic polyneuropathy: Secondary | ICD-10-CM

## 2023-10-16 DIAGNOSIS — E785 Hyperlipidemia, unspecified: Secondary | ICD-10-CM | POA: Diagnosis not present

## 2023-10-16 DIAGNOSIS — Z7984 Long term (current) use of oral hypoglycemic drugs: Secondary | ICD-10-CM | POA: Diagnosis not present

## 2023-10-16 DIAGNOSIS — Z7985 Long-term (current) use of injectable non-insulin antidiabetic drugs: Secondary | ICD-10-CM

## 2023-10-16 LAB — POCT GLYCOSYLATED HEMOGLOBIN (HGB A1C): Hemoglobin A1C: 6.6 % — AB (ref 4.0–5.6)

## 2023-10-16 MED ORDER — GLIPIZIDE 5 MG PO TABS: 5.0000 mg | ORAL_TABLET | Freq: Every day | ORAL | 3 refills | Status: AC

## 2023-10-16 MED ORDER — SEMAGLUTIDE (1 MG/DOSE) 4 MG/3ML ~~LOC~~ SOPN: 1.0000 mg | PEN_INJECTOR | SUBCUTANEOUS | 3 refills | Status: AC

## 2023-10-16 MED ORDER — EMPAGLIFLOZIN 25 MG PO TABS: 25.0000 mg | ORAL_TABLET | Freq: Every day | ORAL | 3 refills | Status: AC

## 2023-10-16 NOTE — Progress Notes (Signed)
 Name: Brian Kennedy  MRN/ DOB: [id number], 1960/03/31   Age/ Sex: 63 y.o., male    PCP: Regino Slater, MD   Reason for Endocrinology Evaluation: Type 2 Diabetes Mellitus     Date of Initial Endocrinology Visit: 07/19/2021    PATIENT IDENTIFIER: Mr. CLIFFORD BENNINGER is a 63 y.o. male with a past medical history of T2DM and dyslipidemia . The patient presented for initial endocrinology clinic visit on 07/19/2021  for consultative assistance with his diabetes management.    HPI: Mr. Ackers was    Diagnosed with DM >20 yrs  Prior Medications tried/Intolerance: Metformin caused nausea                Hemoglobin A1c has ranged from 8.0% in 2023, peaking at 9.8% in 2021.   He has chronic feet pain , attributes to neuropathy . ON Cymbalta   and Gabapentin   On his initial visit to our clinic he had an A1c of 8.8%, patient was on Jardiance , pioglitazone , and glipizide .  We increased glipizide  and pioglitazone  and continue Jardiance    Ozempic  started 05/2022  Stopped Pioglitazone  04/2023 with an A1c 6.6%   SUBJECTIVE:   During the last visit (04/19/2023): A1c 6.6%      Today (10/16/23): Mr. Vermeer is here for a follow up on diabetes management. He checks his blood sugars 2 times daily. The patient has had hypoglycemic episodes since the last clinic visit. He is symptomatic.   Patient has been noted weight loss since his last visit here No nausea or vomiting  Denies constipation but has rare  diarrhea  Continues with right hand and foot tingling   HOME DIABETES REGIMEN: Glipizide  5 mg, BID Jardiance  25 mg daily  Ozempic  1 mg weekly    Statin: yes ACE-I/ARB: yes    METER DOWNLOAD SUMMARY: 9/7-10/06/2023 Fingerstick Blood Glucose Tests = 26 Average Number Tests/Day = 1 Overall Mean FS Glucose = 112 Standard Deviation =27  BG Ranges: Low = 65 High = 174  BG Target % Results: % In target = 92 % Over target = 0 % Under target = 8  Hypoglycemic Events/30  Days: BG < 50 = 0 Episodes of symptomatic severe hypoglycemia = 0     DIABETIC COMPLICATIONS: Microvascular complications:  Neuropathy-follows with neurology Denies: CKD Last eye exam: Completed 2 yrs ago   Macrovascular complications:   Denies: CAD, PVD, CVA   PAST HISTORY: Past Medical History:  Past Medical History:  Diagnosis Date   Acne keloidalis nuchae    Diabetes mellitus    Hypercholesterolemia    Hypertension    Past Surgical History:  Past Surgical History:  Procedure Laterality Date   CYST REMOVAL NECK     HERNIA REPAIR      Social History:  reports that he has never smoked. He has never used smokeless tobacco. He reports that he does not drink alcohol and does not use drugs. Family History:  Family History  Problem Relation Age of Onset   Diabetes Mother    Breast cancer Mother    Stomach cancer Neg Hx    Colon cancer Neg Hx      HOME MEDICATIONS: Allergies as of 10/16/2023   No Known Allergies      Medication List        Accurate as of October 16, 2023  6:58 AM. If you have any questions, ask your nurse or doctor.          aspirin EC 81 MG tablet Take 81  mg by mouth every morning.   bismuth-metronidazole-tetracycline 140-125-125 MG capsule Commonly known as: Pylera Take 3 capsules by mouth 4 (four) times daily -  before meals and at bedtime.   cyclobenzaprine  10 MG tablet Commonly known as: FLEXERIL  Take 10 mg by mouth 3 (three) times daily as needed.   DULoxetine  20 MG capsule Commonly known as: Cymbalta  Take 1 capsule (20 mg total) by mouth daily.   empagliflozin  25 MG Tabs tablet Commonly known as: Jardiance  Take 1 tablet (25 mg total) by mouth daily before breakfast.   gabapentin 300 MG capsule Commonly known as: NEURONTIN Take 300 mg by mouth 3 (three) times daily.   glipiZIDE  5 MG tablet Commonly known as: GLUCOTROL  Take 1 tablet (5 mg total) by mouth 2 (two) times daily before a meal.   hydrocortisone  25 MG  suppository Commonly known as: ANUSOL -HC Place 1 suppository (25 mg total) rectally 2 (two) times daily.   omeprazole 40 MG capsule Commonly known as: PRILOSEC TAKE ONE CAPSULE BY MOUTH TWICE A DAY 30 MINUTES BEFORE MORNING AND EVENING MEALS   ONE TOUCH ULTRA TEST test strip Generic drug: glucose blood 1 each by Other route Daily.   OneTouch UltraSoft 2 Lancets Misc SMARTSIG:Lancet   oxyCODONE -acetaminophen  5-325 MG tablet Commonly known as: PERCOCET/ROXICET Take 1-2 tablets by mouth every 4 (four) hours as needed.   Semaglutide  (1 MG/DOSE) 4 MG/3ML Sopn Inject 1 mg as directed once a week.   simvastatin 40 MG tablet Commonly known as: ZOCOR Take 40 mg by mouth every evening.   tetrahydrozoline 0.05 % ophthalmic solution Place 1 drop into both eyes 4 (four) times daily as needed (For eye irritation.).   valsartan-hydrochlorothiazide 80-12.5 MG tablet Commonly known as: DIOVAN-HCT daily.         ALLERGIES: No Known Allergies   REVIEW OF SYSTEMS: A comprehensive ROS was conducted with the patient and is negative except as per HPI    OBJECTIVE:   VITAL SIGNS: There were no vitals taken for this visit.     There were no vitals filed for this visit.    PHYSICAL EXAM:  General: Pt appears well and is in NAD  Lungs: Clear with good BS bilat   Heart: RRR   Extremities:  Lower extremities -Tarce  pretibial edema. Left shin scabs noted  Neuro: MS is good with appropriate affect, pt is alert and Ox3    DM foot exam: 04/19/2023  The skin of the feet is intact without sores or ulcerations. The pedal pulses are 2+ on right and 2+ on left. The sensation is intact to a screening 5.07, 10 gram monofilament bilaterally    DATA REVIEWED:   Latest Reference Range & Units 10/19/22 08:20  Sodium 135 - 145 mEq/L 141  Potassium 3.5 - 5.1 mEq/L 3.6  Chloride 96 - 112 mEq/L 106  CO2 19 - 32 mEq/L 26  Glucose 70 - 99 mg/dL 871 (H)  BUN 6 - 23 mg/dL 17  Creatinine  9.59 - 1.50 mg/dL 9.02  Calcium 8.4 - 89.4 mg/dL 9.7  GFR >39.99 mL/min 82.94  Total CHOL/HDL Ratio  3  Cholesterol 0 - 200 mg/dL 897  HDL Cholesterol >60.99 mg/dL 61.19 (L)  LDL (calc) 0 - 99 mg/dL 38  MICROALB/CREAT RATIO 0.0 - 30.0 mg/g 1.9  NonHDL  63.31  Triglycerides 0.0 - 149.0 mg/dL 871.9  VLDL 0.0 - 59.9 mg/dL 74.3     Latest Reference Range & Units 10/19/22 08:20  Creatinine,U mg/dL 853.8  Microalb, Ur  0.0 - 1.9 mg/dL 2.7 (H)  MICROALB/CREAT RATIO 0.0 - 30.0 mg/g 1.9    ASSESSMENT / PLAN / RECOMMENDATIONS:   1) Type 2 Diabetes Mellitus,Optimally  controlled, With neuropathic complications - Most recent A1c of 6.6 %. Goal A1c < 7.0 %.    -A1c continues to be at goal -Patient has been noted with hypoglycemia, this typically occurs after dinner, will discontinue glipizide  before dinner but continue the morning dose - MA/CR ratio pending today MEDICATIONS:  - Continue Ozempic  1 mg weekly  - Decrease  glipizide  5 mg , 1 tablet before Breakfast - Continue Jardiance  25 mg daily   EDUCATION / INSTRUCTIONS: BG monitoring instructions: Patient is instructed to check his blood sugars 1 times a day, fasting . Call [facility] if: BG persistently < 70 I reviewed the Rule of 15 for the treatment of hypoglycemia in detail with the patient. Literature supplied.   2) Diabetic complications:  Eye: Does not have known diabetic retinopathy.  Neuro/ Feet: Does  have known diabetic peripheral neuropathy. Renal: Patient does not have known baseline CKD. He is  on an ACEI/ARB at present.  3) Dyslipidemia :   - LDL has been at goal - He assures me compliance with simvastatin   Medication Continue simvastatin 40 mg daily  F/U in 6 months   Signed electronically by: Stefano Redgie Butts, MD  [facility]  [facility] [address] [place], [place] [address] Phone: [phone] FAX: [phone]   CC: Regino Slater,  MD [address] [place] [address] Phone: [phone]  Fax: [phone]    Return to Endocrinology clinic as below: Future Appointments  Date Time Provider Department Center  10/16/2023  7:50 AM Takima Encina, Donell Redgie, MD [id number] None

## 2023-10-16 NOTE — Patient Instructions (Addendum)
 Continue Ozempic  1 mg once weekly  Decease Glipizide  5 mg , before Breakfast only  Continue Jardiance  25 mg daily     HOW TO TREAT LOW BLOOD SUGARS (Blood sugar LESS THAN 70 MG/DL) Please follow the RULE OF 15 for the treatment of hypoglycemia treatment (when your (blood sugars are less than 70 mg/dL)   STEP 1: Take 15 grams of carbohydrates when your blood sugar is low, which includes:  3-4 GLUCOSE TABS  OR 3-4 OZ OF JUICE OR REGULAR SODA OR ONE TUBE OF GLUCOSE GEL    STEP 2: RECHECK blood sugar in 15 MINUTES STEP 3: If your blood sugar is still low at the 15 minute recheck --> then, go back to STEP 1 and treat AGAIN with another 15 grams of carbohydrates.

## 2024-01-25 ENCOUNTER — Other Ambulatory Visit: Payer: Self-pay | Admitting: Family Medicine

## 2024-01-25 DIAGNOSIS — R200 Anesthesia of skin: Secondary | ICD-10-CM

## 2024-02-18 ENCOUNTER — Other Ambulatory Visit

## 2024-04-22 ENCOUNTER — Ambulatory Visit: Admitting: Internal Medicine
